# Patient Record
Sex: Female | Born: 1990 | Race: Black or African American | Hispanic: No | Marital: Single | Smoking: Current every day smoker
Health system: Southern US, Community
[De-identification: ages and names within clinical notes are randomized; demographics above are authoritative.]

## PROBLEM LIST (undated history)

## (undated) ENCOUNTER — Inpatient Hospital Stay (HOSPITAL_COMMUNITY): Payer: Self-pay

## (undated) DIAGNOSIS — IMO0002 Reserved for concepts with insufficient information to code with codable children: Secondary | ICD-10-CM

## (undated) DIAGNOSIS — A5901 Trichomonal vulvovaginitis: Secondary | ICD-10-CM

## (undated) DIAGNOSIS — O163 Unspecified maternal hypertension, third trimester: Secondary | ICD-10-CM

## (undated) DIAGNOSIS — A749 Chlamydial infection, unspecified: Secondary | ICD-10-CM

## (undated) DIAGNOSIS — A599 Trichomoniasis, unspecified: Secondary | ICD-10-CM

## (undated) DIAGNOSIS — J3501 Chronic tonsillitis: Secondary | ICD-10-CM

## (undated) HISTORY — DX: Unspecified maternal hypertension, third trimester: O16.3

## (undated) HISTORY — DX: Trichomoniasis, unspecified: A59.9

---
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
## 2013-02-16 ENCOUNTER — Emergency Department (INDEPENDENT_AMBULATORY_CARE_PROVIDER_SITE_OTHER)
Admission: EM | Admit: 2013-02-16 | Discharge: 2013-02-16 | Disposition: A | Discharge status: Home / Self Care | Payer: 59 | Source: Home / Self Care | Attending: Emergency Medicine | Admitting: Emergency Medicine

## 2013-02-16 ENCOUNTER — Encounter (HOSPITAL_COMMUNITY): Payer: Self-pay | Admitting: *Deleted

## 2013-02-16 DIAGNOSIS — K5289 Other specified noninfective gastroenteritis and colitis: Secondary | ICD-10-CM

## 2013-02-16 DIAGNOSIS — K529 Noninfective gastroenteritis and colitis, unspecified: Secondary | ICD-10-CM

## 2013-02-16 LAB — URINALYSIS, ROUTINE W REFLEX MICROSCOPIC
Nitrite: POSITIVE — AB
Specific Gravity, Urine: 1.023 (ref 1.005–1.030)
Urobilinogen, UA: 1 mg/dL (ref 0.0–1.0)

## 2013-02-16 LAB — CBC WITH DIFFERENTIAL/PLATELET
Basophils Absolute: 0 10*3/uL (ref 0.0–0.1)
Basophils Relative: 0 % (ref 0–1)
Eosinophils Absolute: 0.4 10*3/uL (ref 0.0–0.7)
Hemoglobin: 12.5 g/dL (ref 12.0–15.0)
MCH: 30.6 pg (ref 26.0–34.0)
MCHC: 35.5 g/dL (ref 30.0–36.0)
Monocytes Absolute: 1.2 10*3/uL — ABNORMAL HIGH (ref 0.1–1.0)
Monocytes Relative: 12 % (ref 3–12)
Neutrophils Relative %: 50 % (ref 43–77)
RDW: 12.7 % (ref 11.5–15.5)

## 2013-02-16 LAB — POCT I-STAT, CHEM 8
HCT: 39 % (ref 36.0–46.0)
Hemoglobin: 13.3 g/dL (ref 12.0–15.0)
Sodium: 137 mEq/L (ref 135–145)
TCO2: 25 mmol/L (ref 0–100)

## 2013-02-16 LAB — URINE MICROSCOPIC-ADD ON

## 2013-02-16 LAB — POCT PREGNANCY, URINE: Preg Test, Ur: NEGATIVE

## 2013-02-16 MED ORDER — SODIUM CHLORIDE 0.9 % IV SOLN
INTRAVENOUS | Status: DC
  Administered 2013-02-16: 15:00:00 via INTRAVENOUS

## 2013-02-16 MED ORDER — ONDANSETRON 8 MG PO TBDP: 8.0000 mg | ORAL_TABLET | Freq: Three times a day (TID) | ORAL | Status: DC | PRN

## 2013-02-16 MED ORDER — CIPROFLOXACIN HCL 500 MG PO TABS: 500.0000 mg | ORAL_TABLET | Freq: Two times a day (BID) | ORAL | Status: DC

## 2013-02-16 MED ORDER — METRONIDAZOLE 500 MG PO TABS: 500.0000 mg | ORAL_TABLET | Freq: Two times a day (BID) | ORAL | Status: DC

## 2013-02-16 NOTE — ED Provider Notes (Addendum)
Chief Complaint:   Chief Complaint  Patient presents with  . Nausea    History of Present Illness:   Lorraine Rogers is a 22 year old female who has had a six-day history of headache, dizziness, postural presyncope, chills, and felt feverish she had diarrhea with 6-8 loose stools per day without blood or mucus. She felt nauseated but did not vomit. She had pain in her lower back but denies any abdominal pain. She's had no urinary or GYN complaints. She has not been around anyone with similar symptoms. She had dinner at [facility] the night before the symptoms came on. No other suspicious ingestions. No foreign travel or exotic animal exposure.  Review of Systems:  Other than noted above, the patient denies any of the following symptoms: Systemic:  No fevers, chills, sweats, weight loss or gain, fatigue, or tiredness. ENT:  No nasal congestion, rhinorrhea, or sore throat. Lungs:  No cough, wheezing, or shortness of breath. Cardiac:  No chest pain, syncope, or presyncope. GI:  No abdominal pain, nausea, vomiting, anorexia, diarrhea, constipation, blood in stool or vomitus. GU:  No dysuria, frequency, or urgency.  PMFSH:  Past medical history, family history, social history, meds, and allergies were reviewed.  She is allergic to penicillin sulfa. Her only medication is birth control pills. She is breast-feeding.  Physical Exam:   Vital signs:  BP 94/49  Pulse 92  Temp(Src) 98.7 F (37.1 C) (Oral)  Resp 20  SpO2 100%  LMP 02/16/2013 Filed Vitals:   02/16/13 1355 Supine  02/16/13 1356 Sitting  02/16/13 1357 Standing   BP: 117/70 111/70 94/49  Pulse: 78 83 92  Temp: 98.7 F (37.1 C)    TempSrc: Oral    Resp: 20    SpO2: 100%     General:  Alert and oriented.  In no distress.  Skin warm and dry.  Good skin turgor, brisk capillary refill. ENT:  No scleral icterus, moist mucous membranes, no oral lesions, pharynx clear. Lungs:  Breath sounds clear and equal bilaterally.  No wheezes, rales,  or rhonchi. Heart:  Rhythm regular, without extrasystoles.  No gallops or murmers. Abdomen:  Soft, flat, nondistended. There was no tenderness to palpation, no guarding or rebound. No organomegaly or mass. Bowel sounds were hyperactive. Skin: Clear, warm, and dry.  Good turgor.  Brisk capillary refill.  Labs:   Results for orders placed during the hospital encounter of 02/16/13  CBC WITH DIFFERENTIAL      Result Value Range   WBC 9.6  4.0 - 10.5 K/uL   RBC 4.08  3.87 - 5.11 MIL/uL   Hemoglobin 12.5  12.0 - 15.0 g/dL   HCT 16.1 (*) 09.6 - 04.5 %   MCV 86.3  78.0 - 100.0 fL   MCH 30.6  26.0 - 34.0 pg   MCHC 35.5  30.0 - 36.0 g/dL   RDW 40.9  81.1 - 91.4 %   Platelets 269  150 - 400 K/uL   Neutrophils Relative % 50  43 - 77 %   Neutro Abs 4.8  1.7 - 7.7 K/uL   Lymphocytes Relative 34  12 - 46 %   Lymphs Abs 3.3  0.7 - 4.0 K/uL   Monocytes Relative 12  3 - 12 %   Monocytes Absolute 1.2 (*) 0.1 - 1.0 K/uL   Eosinophils Relative 4  0 - 5 %   Eosinophils Absolute 0.4  0.0 - 0.7 K/uL   Basophils Relative 0  0 - 1 %   Basophils  Absolute 0.0  0.0 - 0.1 K/uL  URINALYSIS, ROUTINE W REFLEX MICROSCOPIC      Result Value Range   Color, Urine RED (*) YELLOW   APPearance TURBID (*) CLEAR   Specific Gravity, Urine 1.023  1.005 - 1.030   pH 5.5  5.0 - 8.0   Glucose, UA NEGATIVE  NEGATIVE mg/dL   Hgb urine dipstick LARGE (*) NEGATIVE   Bilirubin Urine MODERATE (*) NEGATIVE   Ketones, ur 15 (*) NEGATIVE mg/dL   Protein, ur 454 (*) NEGATIVE mg/dL   Urobilinogen, UA 1.0  0.0 - 1.0 mg/dL   Nitrite POSITIVE (*) NEGATIVE   Leukocytes, UA MODERATE (*) NEGATIVE  URINE MICROSCOPIC-ADD ON      Result Value Range   Squamous Epithelial / LPF FEW (*) RARE   WBC, UA 21-50  <3 WBC/hpf   RBC / HPF TOO NUMEROUS TO COUNT  <3 RBC/hpf   Bacteria, UA MANY (*) RARE   Urine-Other TRICHOMONAS PRESENT    POCT PREGNANCY, URINE      Result Value Range   Preg Test, Ur NEGATIVE  NEGATIVE  POCT I-STAT, CHEM 8       Result Value Range   Sodium 137  135 - 145 mEq/L   Potassium 4.1  3.5 - 5.1 mEq/L   Chloride 102  96 - 112 mEq/L   BUN 8  6 - 23 mg/dL   Creatinine, Ser 0.98  0.50 - 1.10 mg/dL   Glucose, Bld 92  70 - 99 mg/dL   Calcium, Ion 1.19 (*) 1.12 - 1.23 mmol/L   TCO2 25  0 - 100 mmol/L   Hemoglobin 13.3  12.0 - 15.0 g/dL   HCT 14.7  82.9 - 56.2 %      Course in Urgent Care Center:   She was given 1 L of normal saline intravenously. Thereafter she felt a lot better.  Assessment:  The encounter diagnosis was Gastroenteritis.  May also have a urinary tract infection. Urine culture is pending. Also she has Trichomonas in her urine and will need to treat this as well. Will obtain a stool for culture and Clostridium difficile PCR. We'll go ahead and treat with Cipro since this is been going on for almost a week. Return if no better in 3-4 days. The patient was reminded not to breast-feed while taking the antibiotics.  Plan:   1.  The following meds were prescribed:   Discharge Medication List as of 02/16/2013  5:24 PM    START taking these medications   Details  ciprofloxacin (CIPRO) 500 MG tablet Take 1 tablet (500 mg total) by mouth every 12 (twelve) hours., Starting 02/16/2013, Until Discontinued, Normal    ondansetron (ZOFRAN ODT) 8 MG disintegrating tablet Take 1 tablet (8 mg total) by mouth every 8 (eight) hours as needed for nausea., Starting 02/16/2013, Until Discontinued, Normal       A prescription for Flagyl 500 mg twice a day for one week was also called into her pharmacy.  2.  The patient was instructed in symptomatic care and handouts were given. 3.  The patient was told to return if becoming worse in any way, if no better in 2 or 3 days, and given some red flag symptoms such as fever, worsening pain, or persistent vomiting that would indicate earlier return. 4.  The patient was told to take only sips of clear liquids for the next 24 hours and then advance to a b.r.a.t. Diet. 5.   Follow up here if  necessary.      Reuben Likes, MD 02/16/13 1939  Addendum: The patient's stool showed abundant yeast but no salmonella, shigella, Campylobacter, or Escherichia coli. Does not require any further treatment.  Reuben Likes, MD 03/06/13 2125

## 2013-02-16 NOTE — ED Notes (Signed)
Patient complains of nausea x 5 days; with cough, fever and chills. Denies diarrhea, vomiting. States pain in abdomen right lateral side.

## 2013-02-18 LAB — URINE CULTURE

## 2013-02-18 NOTE — ED Notes (Addendum)
Urine culture: >100,000 colonies E. Coli. Pt. adequately treated with Cipro. Vassie Moselle 02/18/2013 [facility] mailed a stool culture report: Abundant yeast, No salmonella, Shigella, Campylobacter, Yersinia, or E. Coli isolated. Reduced Flora present.  Lab shown to Dr. Artis Flock.  He said no further action and show it to Dr. Lorenz Coaster. I do not see the result on pt.'s chart. 03/04/2013

## 2014-01-14 ENCOUNTER — Encounter (HOSPITAL_BASED_OUTPATIENT_CLINIC_OR_DEPARTMENT_OTHER): Payer: Self-pay | Admitting: *Deleted

## 2014-01-19 ENCOUNTER — Encounter (HOSPITAL_BASED_OUTPATIENT_CLINIC_OR_DEPARTMENT_OTHER)
Admission: RE | Disposition: A | Discharge status: Home / Self Care | Payer: Self-pay | Source: Ambulatory Visit | Attending: Otolaryngology

## 2014-01-19 ENCOUNTER — Ambulatory Visit (HOSPITAL_BASED_OUTPATIENT_CLINIC_OR_DEPARTMENT_OTHER)
Admission: RE | Admit: 2014-01-19 | Discharge: 2014-01-19 | Disposition: A | Discharge status: Home / Self Care | Payer: 59 | Source: Ambulatory Visit | Attending: Otolaryngology | Admitting: Otolaryngology

## 2014-01-19 ENCOUNTER — Ambulatory Visit (HOSPITAL_BASED_OUTPATIENT_CLINIC_OR_DEPARTMENT_OTHER): Payer: 59 | Admitting: Anesthesiology

## 2014-01-19 ENCOUNTER — Encounter (HOSPITAL_BASED_OUTPATIENT_CLINIC_OR_DEPARTMENT_OTHER): Payer: 59 | Admitting: Anesthesiology

## 2014-01-19 ENCOUNTER — Encounter (HOSPITAL_BASED_OUTPATIENT_CLINIC_OR_DEPARTMENT_OTHER): Payer: Self-pay | Admitting: *Deleted

## 2014-01-19 DIAGNOSIS — J3501 Chronic tonsillitis: Secondary | ICD-10-CM

## 2014-01-19 DIAGNOSIS — F1010 Alcohol abuse, uncomplicated: Secondary | ICD-10-CM | POA: Insufficient documentation

## 2014-01-19 DIAGNOSIS — Z79899 Other long term (current) drug therapy: Secondary | ICD-10-CM | POA: Insufficient documentation

## 2014-01-19 DIAGNOSIS — Z880 Allergy status to penicillin: Secondary | ICD-10-CM | POA: Insufficient documentation

## 2014-01-19 HISTORY — DX: Chronic tonsillitis: J35.01

## 2014-01-19 HISTORY — PX: TONSILLECTOMY: SHX5217

## 2014-01-19 LAB — POCT HEMOGLOBIN-HEMACUE: Hemoglobin: 12.4 g/dL (ref 12.0–15.0)

## 2014-01-19 SURGERY — TONSILLECTOMY
Anesthesia: General | Site: Throat

## 2014-01-19 MED ORDER — ONDANSETRON HCL 4 MG/2ML IJ SOLN
INTRAMUSCULAR | Status: DC | PRN
  Administered 2014-01-19: 4 mg via INTRAVENOUS

## 2014-01-19 MED ORDER — SUCCINYLCHOLINE CHLORIDE 20 MG/ML IJ SOLN
INTRAMUSCULAR | Status: DC | PRN
  Administered 2014-01-19: 100 mg via INTRAVENOUS

## 2014-01-19 MED ORDER — PROMETHAZINE HCL 12.5 MG RE SUPP: 12.5000 mg | Freq: Four times a day (QID) | RECTAL | Status: DC | PRN

## 2014-01-19 MED ORDER — PROPOFOL 10 MG/ML IV BOLUS
INTRAVENOUS | Status: DC | PRN
  Administered 2014-01-19: 250 mg via INTRAVENOUS

## 2014-01-19 MED ORDER — MORPHINE SULFATE 2 MG/ML IJ SOLN: 2.0000 mg | INTRAMUSCULAR | Status: DC | PRN

## 2014-01-19 MED ORDER — LIDOCAINE HCL (CARDIAC) 10 MG/ML IV SOLN
INTRAVENOUS | Status: DC | PRN
  Administered 2014-01-19: 100 mg via INTRAVENOUS

## 2014-01-19 MED ORDER — HYDROMORPHONE HCL PF 1 MG/ML IJ SOLN
0.2500 mg | INTRAMUSCULAR | Status: DC | PRN
  Administered 2014-01-19 (×2): 0.5 mg via INTRAVENOUS

## 2014-01-19 MED ORDER — MIDAZOLAM HCL 2 MG/2ML IJ SOLN: 1.0000 mg | INTRAMUSCULAR | Status: DC | PRN

## 2014-01-19 MED ORDER — PROMETHAZINE HCL 12.5 MG PO TABS: 12.5000 mg | ORAL_TABLET | Freq: Four times a day (QID) | ORAL | Status: DC | PRN

## 2014-01-19 MED ORDER — BACITRACIN ZINC 500 UNIT/GM EX OINT
TOPICAL_OINTMENT | CUTANEOUS | Status: AC
  Filled 2014-01-19: qty 0.9

## 2014-01-19 MED ORDER — HYDROMORPHONE HCL PF 1 MG/ML IJ SOLN
INTRAMUSCULAR | Status: AC
  Filled 2014-01-19: qty 1

## 2014-01-19 MED ORDER — OXYCODONE HCL 5 MG PO TABS: 5.0000 mg | ORAL_TABLET | Freq: Once | ORAL | Status: DC | PRN

## 2014-01-19 MED ORDER — KCL IN DEXTROSE-NACL 20-5-0.45 MEQ/L-%-% IV SOLN
INTRAVENOUS | Status: DC
  Administered 2014-01-19: 12:00:00 via INTRAVENOUS
  Filled 2014-01-19: qty 1000

## 2014-01-19 MED ORDER — LACTATED RINGERS IV SOLN
INTRAVENOUS | Status: DC
  Administered 2014-01-19 (×2): via INTRAVENOUS

## 2014-01-19 MED ORDER — HYDROCODONE-ACETAMINOPHEN 7.5-325 MG/15ML PO SOLN
15.0000 mL | ORAL | Status: DC | PRN
  Administered 2014-01-19: 15 mL via ORAL
  Filled 2014-01-19: qty 15

## 2014-01-19 MED ORDER — DEXAMETHASONE SODIUM PHOSPHATE 4 MG/ML IJ SOLN
INTRAMUSCULAR | Status: DC | PRN
  Administered 2014-01-19: 10 mg via INTRAVENOUS

## 2014-01-19 MED ORDER — BACITRACIN 500 UNIT/GM EX OINT
TOPICAL_OINTMENT | CUTANEOUS | Status: DC | PRN
Start: 2014-01-19 — End: 2014-01-19
  Administered 2014-01-19: 1 via TOPICAL

## 2014-01-19 MED ORDER — OXYMETAZOLINE HCL 0.05 % NA SOLN
NASAL | Status: AC
  Filled 2014-01-19: qty 15

## 2014-01-19 MED ORDER — METOCLOPRAMIDE HCL 5 MG/ML IJ SOLN: 10.0000 mg | Freq: Once | INTRAMUSCULAR | Status: DC | PRN

## 2014-01-19 MED ORDER — FENTANYL CITRATE 0.05 MG/ML IJ SOLN: 50.0000 ug | INTRAMUSCULAR | Status: DC | PRN

## 2014-01-19 MED ORDER — OXYCODONE HCL 5 MG/5ML PO SOLN: 5.0000 mg | Freq: Once | ORAL | Status: DC | PRN

## 2014-01-19 MED ORDER — HYDROCODONE-ACETAMINOPHEN 7.5-325 MG/15ML PO SOLN: 15.0000 mL | Freq: Four times a day (QID) | ORAL | Status: AC | PRN

## 2014-01-19 MED ORDER — METOCLOPRAMIDE HCL 5 MG/ML IJ SOLN
INTRAMUSCULAR | Status: DC | PRN
  Administered 2014-01-19: 10 mg via INTRAVENOUS

## 2014-01-19 MED ORDER — FENTANYL CITRATE 0.05 MG/ML IJ SOLN
INTRAMUSCULAR | Status: DC | PRN
  Administered 2014-01-19 (×2): 50 ug via INTRAVENOUS
  Administered 2014-01-19: 100 ug via INTRAVENOUS

## 2014-01-19 MED ORDER — MIDAZOLAM HCL 2 MG/2ML IJ SOLN
INTRAMUSCULAR | Status: AC
  Filled 2014-01-19: qty 2

## 2014-01-19 MED ORDER — FENTANYL CITRATE 0.05 MG/ML IJ SOLN
INTRAMUSCULAR | Status: AC
  Filled 2014-01-19: qty 6

## 2014-01-19 MED ORDER — MIDAZOLAM HCL 5 MG/5ML IJ SOLN
INTRAMUSCULAR | Status: DC | PRN
  Administered 2014-01-19: 2 mg via INTRAVENOUS

## 2014-01-19 SURGICAL SUPPLY — 29 items
CANISTER SUCT 1200ML W/VALVE (MISCELLANEOUS) ×3 IMPLANT
CATH ROBINSON RED A/P 10FR (CATHETERS) IMPLANT
CATH ROBINSON RED A/P 14FR (CATHETERS) IMPLANT
CLEANER CAUTERY TIP 5X5 PAD (MISCELLANEOUS) IMPLANT
COAGULATOR SUCT 6 FR SWTCH (ELECTROSURGICAL) ×1
COAGULATOR SUCT SWTCH 10FR 6 (ELECTROSURGICAL) ×2 IMPLANT
COVER MAYO STAND STRL (DRAPES) ×3 IMPLANT
ELECT COATED BLADE 2.86 ST (ELECTRODE) ×3 IMPLANT
ELECT REM PT RETURN 9FT ADLT (ELECTROSURGICAL) ×3
ELECT REM PT RETURN 9FT PED (ELECTROSURGICAL)
ELECTRODE REM PT RETRN 9FT PED (ELECTROSURGICAL) IMPLANT
ELECTRODE REM PT RTRN 9FT ADLT (ELECTROSURGICAL) IMPLANT
GLOVE BIO SURGEON STRL SZ7.5 (GLOVE) ×3 IMPLANT
GLOVE ECLIPSE 6.5 STRL STRAW (GLOVE) ×2 IMPLANT
GOWN STRL REUS W/ TWL LRG LVL3 (GOWN DISPOSABLE) ×2 IMPLANT
GOWN STRL REUS W/TWL LRG LVL3 (GOWN DISPOSABLE) ×6
NS IRRIG 1000ML POUR BTL (IV SOLUTION) ×3 IMPLANT
PAD CLEANER CAUTERY TIP 5X5 (MISCELLANEOUS)
PENCIL FOOT CONTROL (ELECTRODE) ×3 IMPLANT
SHEET MEDIUM DRAPE 40X70 STRL (DRAPES) ×3 IMPLANT
SOLUTION BUTLER CLEAR DIP (MISCELLANEOUS) ×3 IMPLANT
SPONGE GAUZE 4X4 12PLY STER LF (GAUZE/BANDAGES/DRESSINGS) ×3 IMPLANT
SPONGE TONSIL 1 RF SGL (DISPOSABLE) IMPLANT
SPONGE TONSIL 1.25 RF SGL STRG (GAUZE/BANDAGES/DRESSINGS) ×2 IMPLANT
SYR BULB 3OZ (MISCELLANEOUS) ×3 IMPLANT
TOWEL OR 17X24 6PK STRL BLUE (TOWEL DISPOSABLE) ×3 IMPLANT
TUBE CONNECTING 20'X1/4 (TUBING) ×1
TUBE CONNECTING 20X1/4 (TUBING) ×2 IMPLANT
TUBE SALEM SUMP 16 FR W/ARV (TUBING) ×3 IMPLANT

## 2014-01-19 NOTE — Anesthesia Postprocedure Evaluation (Signed)
Anesthesia Post Note  Patient: Lorraine Rogers  Procedure(s) Performed: Procedure(s) (LRB): TONSILLECTOMY (N/A)  Anesthesia type: General  Patient location: PACU  Post pain: Pain level controlled  Post assessment: Patient's Cardiovascular Status Stable  Last Vitals:  Filed Vitals:   01/19/14 1159  BP: 120/73  Pulse: 58  Temp: 36.7 C  Resp: 16    Post vital signs: Reviewed and stable  Level of consciousness: alert  Complications: No apparent anesthesia complications

## 2014-01-19 NOTE — Anesthesia Preprocedure Evaluation (Signed)
Anesthesia Evaluation  Patient identified by MRN, date of birth, ID band Patient awake    Reviewed: Allergy & Precautions, H&P , NPO status , Patient's Chart, lab work & pertinent test results, reviewed documented beta blocker date and time   Airway Mallampati: II TM Distance: >3 FB Neck ROM: full    Dental   Pulmonary neg pulmonary ROS,  breath sounds clear to auscultation        Cardiovascular negative cardio ROS  Rhythm:regular     Neuro/Psych negative neurological ROS  negative psych ROS   GI/Hepatic negative GI ROS, Neg liver ROS,   Endo/Other  negative endocrine ROS  Renal/GU negative Renal ROS  negative genitourinary   Musculoskeletal   Abdominal   Peds  Hematology negative hematology ROS (+)   Anesthesia Other Findings See surgeon's H&P   Reproductive/Obstetrics negative OB ROS                           Anesthesia Physical Anesthesia Plan  ASA: III  Anesthesia Plan: General   Post-op Pain Management:    Induction: Intravenous  Airway Management Planned: Oral ETT  Additional Equipment:   Intra-op Plan:   Post-operative Plan: Extubation in OR  Informed Consent: I have reviewed the patients History and Physical, chart, labs and discussed the procedure including the risks, benefits and alternatives for the proposed anesthesia with the patient or authorized representative who has indicated his/her understanding and acceptance.   Dental Advisory Given  Plan Discussed with: CRNA and Surgeon  Anesthesia Plan Comments:         Anesthesia Quick Evaluation

## 2014-01-19 NOTE — Discharge Instructions (Signed)
Call your surgeon if you experience:  °1.  Fever over 101.0. °2.  Inability to urinate. °3.  Nausea and/or vomiting. °4.  Extreme swelling or bruising at the surgical site. °5.  Continued bleeding from the incision. °6.  Increased pain, redness or drainage from the incision. °7.  Problems related to your pain medication. °8. Any change in color, movement and/or sensation °9. Any problems and/or concerns ° ° °Post Anesthesia Home Care Instructions °Activity: °Get plenty of rest for the remainder of the day. A responsible adult should stay with you for 24 hours following the procedure.  °For the next 24 hours, DO NOT: °-Drive a car °-Operate machinery °-Drink alcoholic beverages °-Take any medication unless instructed by your physician °-Make any legal decisions or sign important papers. ° °Meals: °Start with liquid foods such as gelatin or soup. Progress to regular foods as tolerated. Avoid greasy, spicy, heavy foods. If nausea and/or vomiting occur, drink only clear liquids until the nausea and/or vomiting subsides. Call your physician if vomiting continues. ° °Special Instructions/Symptoms: °Your throat may feel dry or sore from the anesthesia or the breathing tube placed in your throat during surgery. If this causes discomfort, gargle with warm salt water. The discomfort should disappear within 24 hours. ° °

## 2014-01-19 NOTE — H&P (Signed)
Lorraine Rogers is an 23 y.o. female.   Chief Complaint: Chronic tonsillitis HPI: 23 year old female with a couple of recent episodes of severe tonsillitis presents for tonsillectomy.  Past Medical History  Diagnosis Date  . Tonsillitis, chronic   . No pertinent past medical history     Past Surgical History  Procedure Laterality Date  . No past surgeries      Family History  Problem Relation Age of Onset  . Diabetes Mother    Social History:  reports that she has never smoked. She does not have any smokeless tobacco history on file. She reports that she drinks alcohol. She reports that she does not use illicit drugs.  Allergies:  Allergies  Allergen Reactions  . Penicillins Hives and Rash  . Sulfa Antibiotics Hives and Rash    Medications Prior to Admission  Medication Sig Dispense Refill  . PRESCRIPTION MEDICATION Take 1 tablet by mouth daily. Birth Control Pill        Results for orders placed during the hospital encounter of 01/19/14 (from the past 48 hour(s))  POCT HEMOGLOBIN-HEMACUE     Status: None   Collection Time    01/19/14  9:26 AM      Result Value Ref Range   Hemoglobin 12.4  12.0 - 15.0 g/dL   No results found.  Review of Systems  All other systems reviewed and are negative.   Blood pressure 126/75, pulse 59, temperature 98.4 F (36.9 C), temperature source Oral, resp. rate 20, height 5\' 6"  (1.676 m), weight 117.935 kg (260 lb), last menstrual period 11/14/2013, SpO2 99.00%. Physical Exam  Constitutional: She is oriented to person, place, and time. She appears well-developed and well-nourished. No distress.  HENT:  Head: Normocephalic and atraumatic.  Right Ear: External ear normal.  Left Ear: External ear normal.  Nose: Nose normal.  Mouth/Throat: Oropharynx is clear and moist.  Eyes: Conjunctivae and EOM are normal. Pupils are equal, round, and reactive to light.  Neck: Normal range of motion. Neck supple.  Cardiovascular: Normal rate.    Respiratory: Effort normal.  Musculoskeletal: Normal range of motion.  Neurological: She is alert and oriented to person, place, and time. No cranial nerve deficit.  Skin: Skin is warm and dry.  Psychiatric: She has a normal mood and affect. Her behavior is normal. Judgment and thought content normal.     Assessment/Plan Chronic tonsillitis To OR for tonsillectomy.  Kenon Delashmit 01/19/2014, 9:56 AM

## 2014-01-19 NOTE — Brief Op Note (Signed)
01/19/2014  10:33 AM  PATIENT:  Lorraine Rogers  23 y.o. female  PRE-OPERATIVE DIAGNOSIS:  chronic tonsillitis and hypertrophy  POST-OPERATIVE DIAGNOSIS:  chronic tonsillitis and hypertrophy  PROCEDURE:  Procedure(s): TONSILLECTOMY (N/A)  SURGEON:  Surgeon(s) and Role:    * Christia Readingwight Aaronmichael Brumbaugh, MD - Primary  PHYSICIAN ASSISTANT:   ASSISTANTS: none   ANESTHESIA:   general  EBL:     BLOOD ADMINISTERED:none  DRAINS: none   LOCAL MEDICATIONS USED:  NONE  SPECIMEN:  Source of Specimen:  Right and left tonsils  DISPOSITION OF SPECIMEN:  PATHOLOGY  COUNTS:  YES  TOURNIQUET:  * No tourniquets in log *  DICTATION: .Other Dictation: Dictation Number [id number]  PLAN OF CARE: Admit for overnight observation  PATIENT DISPOSITION:  PACU - hemodynamically stable.   Delay start of Pharmacological VTE agent (>24hrs) due to surgical blood loss or risk of bleeding: yes

## 2014-01-19 NOTE — Transfer of Care (Signed)
Immediate Anesthesia Transfer of Care Note  Patient: Lorraine Rogers  Procedure(s) Performed: Procedure(s): TONSILLECTOMY (N/A)  Patient Location: PACU  Anesthesia Type:General  Level of Consciousness: awake, sedated and patient cooperative  Airway & Oxygen Therapy: Patient Spontanous Breathing and aerosol face mask  Post-op Assessment: Report given to PACU RN and Post -op Vital signs reviewed and stable  Post vital signs: Reviewed and stable  Complications: No apparent anesthesia complications

## 2014-01-19 NOTE — Anesthesia Procedure Notes (Signed)
Procedure Name: Intubation Date/Time: 01/19/2014 10:09 AM Performed by: Gar GibbonKEETON, Leshae Mcclay S Pre-anesthesia Checklist: Patient identified, Emergency Drugs available, Suction available and Patient being monitored Patient Re-evaluated:Patient Re-evaluated prior to inductionOxygen Delivery Method: Circle System Utilized Preoxygenation: Pre-oxygenation with 100% oxygen Intubation Type: IV induction Ventilation: Mask ventilation without difficulty Laryngoscope Size: Mac and 3 Grade View: Grade II Tube type: Oral Tube size: 7.0 mm Number of attempts: 1 Airway Equipment and Method: stylet and oral airway Placement Confirmation: ETT inserted through vocal cords under direct vision,  positive ETCO2 and breath sounds checked- equal and bilateral Secured at: 20 cm Tube secured with: Tape Dental Injury: Teeth and Oropharynx as per pre-operative assessment

## 2014-01-20 ENCOUNTER — Encounter (HOSPITAL_BASED_OUTPATIENT_CLINIC_OR_DEPARTMENT_OTHER): Payer: Self-pay | Admitting: Otolaryngology

## 2014-01-20 NOTE — Op Note (Signed)
Lorraine Rogers:  Lorraine Rogers, Lorraine Rogers                ACCOUNT NO.:  [id number]  MEDICAL RECORD NO.:  [id number]  LOCATION:                                 FACILITY:  PHYSICIAN:  Antony Contraswight D Alvina Strother, MD     DATE OF BIRTH:  04/17/1991  DATE OF PROCEDURE:  01/19/2014 DATE OF DISCHARGE:  01/19/2014                              OPERATIVE REPORT   PREOPERATIVE DIAGNOSIS:  Chronic tonsillitis and tonsillar hypertrophy.  POSTOPERATIVE DIAGNOSES:  Chronic tonsillitis and tonsillar hypertrophy.  PROCEDURE:  Tonsillectomy.  SURGEON:  Antony Contraswight D Draycen Leichter, MD  ANESTHESIA:  General endotracheal anesthesia.  COMPLICATIONS:  None.  INDICATION:  The patient is a 23 year old female, who has had a couple of recent severe tonsil infections that have required antibiotic and steroid therapy.  In addition, her tonsils were found to be enlarged. She presents to the operating room for surgical management.  FINDINGS:  Tonsils are 3+.  DESCRIPTION OF PROCEDURE:  The patient was identified in the holding room.  Informed consent having been obtained including discussion of risks, benefits, alternatives.  The patient was brought to the operative suite, and put on the operating table in supine position.  Anesthesia was induced.  The patient was intubated by anesthesia team without difficulty.  The patient was given intravenous steroids during the case. The eyes were taped closed and bed was turned 90 degrees from anesthesia.  Head wrap was placed around the patient's head and a Crowe- Davis retractor was inserted in the mouth and opened through the oropharynx.  This placed in suspension on Mayo stand using a rack.  The right tonsil was grasped with a curved Allis and retracted medially while a curvilinear incision was made along the anterior tonsillar pillar using Bovie electrocautery on a setting of 20.  Dissection continued subcapsular plane until the tonsil was removed.  The same was then done on the left side.  Tonsils were  sent separately for pathology. Bleeding was controlled on both sides using suction cautery.  There was a bit of blood loss in this process as there were sizable bleeders on both sides.  After bleeding was controlled, the nose and throat were copiously irrigated with saline and a flexible catheter was passed down the esophagus to suck out the stomach and esophagus.  Retractor was taken out of suspension and removed from the patient's mouth.  She was turned back to anesthesia for wake-up and was extubated, moved to room in stable condition.     Antony Contraswight D Latrena Benegas, MD     DDB/MEDQ  D:  01/19/2014  T:  01/20/2014  Job:  [id number]

## 2014-02-05 ENCOUNTER — Other Ambulatory Visit: Payer: Self-pay | Admitting: Family

## 2014-02-05 DIAGNOSIS — R52 Pain, unspecified: Secondary | ICD-10-CM

## 2014-02-05 DIAGNOSIS — T148XXA Other injury of unspecified body region, initial encounter: Secondary | ICD-10-CM

## 2014-02-06 ENCOUNTER — Ambulatory Visit
Admission: RE | Admit: 2014-02-06 | Discharge: 2014-02-06 | Disposition: A | Discharge status: Home / Self Care | Payer: 59 | Source: Ambulatory Visit | Attending: Family | Admitting: Family

## 2014-02-06 DIAGNOSIS — T148XXA Other injury of unspecified body region, initial encounter: Secondary | ICD-10-CM

## 2014-02-06 DIAGNOSIS — R52 Pain, unspecified: Secondary | ICD-10-CM

## 2014-05-04 ENCOUNTER — Encounter (HOSPITAL_BASED_OUTPATIENT_CLINIC_OR_DEPARTMENT_OTHER): Payer: Self-pay | Admitting: Otolaryngology

## 2014-11-20 ENCOUNTER — Emergency Department (HOSPITAL_COMMUNITY)
Admission: EM | Admit: 2014-11-20 | Discharge: 2014-11-20 | Disposition: A | Discharge status: Home / Self Care | Payer: 59 | Attending: Emergency Medicine | Admitting: Emergency Medicine

## 2014-11-20 ENCOUNTER — Encounter (HOSPITAL_COMMUNITY): Payer: Self-pay | Admitting: Emergency Medicine

## 2014-11-20 ENCOUNTER — Emergency Department (HOSPITAL_COMMUNITY): Payer: 59

## 2014-11-20 DIAGNOSIS — Y9289 Other specified places as the place of occurrence of the external cause: Secondary | ICD-10-CM | POA: Insufficient documentation

## 2014-11-20 DIAGNOSIS — Z720 Tobacco use: Secondary | ICD-10-CM | POA: Insufficient documentation

## 2014-11-20 DIAGNOSIS — Y9389 Activity, other specified: Secondary | ICD-10-CM | POA: Insufficient documentation

## 2014-11-20 DIAGNOSIS — Z8709 Personal history of other diseases of the respiratory system: Secondary | ICD-10-CM | POA: Insufficient documentation

## 2014-11-20 DIAGNOSIS — Y998 Other external cause status: Secondary | ICD-10-CM | POA: Insufficient documentation

## 2014-11-20 DIAGNOSIS — Z880 Allergy status to penicillin: Secondary | ICD-10-CM | POA: Diagnosis not present

## 2014-11-20 DIAGNOSIS — Z793 Long term (current) use of hormonal contraceptives: Secondary | ICD-10-CM | POA: Diagnosis not present

## 2014-11-20 DIAGNOSIS — S6991XA Unspecified injury of right wrist, hand and finger(s), initial encounter: Secondary | ICD-10-CM | POA: Diagnosis present

## 2014-11-20 DIAGNOSIS — S60221A Contusion of right hand, initial encounter: Secondary | ICD-10-CM | POA: Diagnosis not present

## 2014-11-20 NOTE — Discharge Instructions (Signed)
Contusion °A contusion is a deep bruise. Contusions are the result of an injury that caused bleeding under the skin. The contusion may turn blue, purple, or yellow. Minor injuries will give you a painless contusion, but more severe contusions may stay painful and swollen for a few weeks.  °CAUSES  °A contusion is usually caused by a blow, trauma, or direct force to an area of the body. °SYMPTOMS  °· Swelling and redness of the injured area. °· Bruising of the injured area. °· Tenderness and soreness of the injured area. °· Pain. °DIAGNOSIS  °The diagnosis can be made by taking a history and physical exam. An X-ray, CT scan, or MRI may be needed to determine if there were any associated injuries, such as fractures. °TREATMENT  °Specific treatment will depend on what area of the body was injured. In general, the best treatment for a contusion is resting, icing, elevating, and applying cold compresses to the injured area. Over-the-counter medicines may also be recommended for pain control. Ask your caregiver what the best treatment is for your contusion. °HOME CARE INSTRUCTIONS  °· Put ice on the injured area. °¨ Put ice in a plastic bag. °¨ Place a towel between your skin and the bag. °¨ Leave the ice on for 15-20 minutes, 3-4 times a day, or as directed by your health care provider. °· Only take over-the-counter or prescription medicines for pain, discomfort, or fever as directed by your caregiver. Your caregiver may recommend avoiding anti-inflammatory medicines (aspirin, ibuprofen, and naproxen) for 48 hours because these medicines may increase bruising. °· Rest the injured area. °· If possible, elevate the injured area to reduce swelling. °SEEK IMMEDIATE MEDICAL CARE IF:  °· You have increased bruising or swelling. °· You have pain that is getting worse. °· Your swelling or pain is not relieved with medicines. °MAKE SURE YOU:  °· Understand these instructions. °· Will watch your condition. °· Will get help right  away if you are not doing well or get worse. °Document Released: 03/29/2005 Document Revised: 06/24/2013 Document Reviewed: 04/24/2011 °[employer]® Patient Information ©2015 [employer]. This information is not intended to replace advice given to you by your health care provider. Make sure you discuss any questions you have with your health care provider. ° °
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
## 2015-04-06 ENCOUNTER — Encounter (HOSPITAL_COMMUNITY): Payer: Self-pay | Admitting: *Deleted

## 2015-04-06 ENCOUNTER — Inpatient Hospital Stay (HOSPITAL_COMMUNITY)
Admission: AD | Admit: 2015-04-06 | Discharge: 2015-04-06 | Disposition: A | Discharge status: Home / Self Care | Payer: 59 | Source: Ambulatory Visit | Attending: Obstetrics & Gynecology | Admitting: Obstetrics & Gynecology

## 2015-04-06 ENCOUNTER — Inpatient Hospital Stay (HOSPITAL_COMMUNITY): Payer: 59

## 2015-04-06 DIAGNOSIS — O209 Hemorrhage in early pregnancy, unspecified: Secondary | ICD-10-CM | POA: Diagnosis not present

## 2015-04-06 DIAGNOSIS — O039 Complete or unspecified spontaneous abortion without complication: Secondary | ICD-10-CM | POA: Insufficient documentation

## 2015-04-06 DIAGNOSIS — Z880 Allergy status to penicillin: Secondary | ICD-10-CM | POA: Diagnosis not present

## 2015-04-06 DIAGNOSIS — F17210 Nicotine dependence, cigarettes, uncomplicated: Secondary | ICD-10-CM | POA: Diagnosis not present

## 2015-04-06 LAB — URINALYSIS, ROUTINE W REFLEX MICROSCOPIC
Bilirubin Urine: NEGATIVE
Glucose, UA: NEGATIVE mg/dL
Ketones, ur: NEGATIVE mg/dL
Nitrite: NEGATIVE
PH: 7 (ref 5.0–8.0)
PROTEIN: 100 mg/dL — AB
Specific Gravity, Urine: 1.015 (ref 1.005–1.030)
Urobilinogen, UA: 1 mg/dL (ref 0.0–1.0)

## 2015-04-06 LAB — CBC
HCT: 34.6 % — ABNORMAL LOW (ref 36.0–46.0)
Hemoglobin: 11.5 g/dL — ABNORMAL LOW (ref 12.0–15.0)
MCH: 29.8 pg (ref 26.0–34.0)
MCHC: 33.2 g/dL (ref 30.0–36.0)
MCV: 89.6 fL (ref 78.0–100.0)
PLATELETS: 218 10*3/uL (ref 150–400)
RBC: 3.86 MIL/uL — ABNORMAL LOW (ref 3.87–5.11)
RDW: 13.2 % (ref 11.5–15.5)
WBC: 11.7 10*3/uL — ABNORMAL HIGH (ref 4.0–10.5)

## 2015-04-06 LAB — URINE MICROSCOPIC-ADD ON

## 2015-04-06 LAB — WET PREP, GENITAL
Clue Cells Wet Prep HPF POC: NONE SEEN
Trich, Wet Prep: NONE SEEN
WBC WET PREP: NONE SEEN
Yeast Wet Prep HPF POC: NONE SEEN

## 2015-04-06 LAB — HCG, QUANTITATIVE, PREGNANCY: hCG, Beta Chain, Quant, S: 12450 m[IU]/mL — ABNORMAL HIGH (ref <5)

## 2015-04-06 LAB — POCT PREGNANCY, URINE: Preg Test, Ur: POSITIVE — AB

## 2015-04-06 LAB — HIV ANTIBODY (ROUTINE TESTING W REFLEX): HIV SCREEN 4TH GENERATION: NONREACTIVE

## 2015-04-06 MED ORDER — LACTATED RINGERS IV SOLN
INTRAVENOUS | Status: DC
  Administered 2015-04-06: 10:00:00 via INTRAVENOUS

## 2015-04-06 MED ORDER — HYDROMORPHONE HCL 1 MG/ML IJ SOLN
0.5000 mg | Freq: Once | INTRAMUSCULAR | Status: AC
  Administered 2015-04-06: 0.5 mg via INTRAVENOUS
  Filled 2015-04-06: qty 1

## 2015-04-06 MED ORDER — OXYCODONE-ACETAMINOPHEN 5-325 MG PO TABS: 1.0000 | ORAL_TABLET | Freq: Four times a day (QID) | ORAL | Status: DC | PRN

## 2015-04-06 MED ORDER — MISOPROSTOL 200 MCG PO TABS: 400.0000 ug | ORAL_TABLET | Freq: Four times a day (QID) | ORAL | Status: DC

## 2015-04-06 MED ORDER — IBUPROFEN 600 MG PO TABS: 600.0000 mg | ORAL_TABLET | Freq: Four times a day (QID) | ORAL | Status: DC | PRN

## 2015-04-06 NOTE — MAU Note (Signed)
IV fluids restarted per order.

## 2015-04-06 NOTE — Progress Notes (Signed)
Wynelle Bourgeois CNM in to discuss test results and d/c plan with pt. Emotional support given

## 2015-04-06 NOTE — MAU Provider Note (Signed)
History     CSN: [id number]  Arrival date and time: 04/06/15 4098   First Provider Initiated Contact with Patient 04/06/15 0809      Chief Complaint  Patient presents with  . Vaginal Bleeding  . Abdominal Pain    This is a 24 y.o. female who presents via EMS after reportedly bleeding heavily and passing a large clot at home. Also has cramping. States she stopped using OCPS a few months ago and never restarted any birth control   States her doctor told her since she was allergic to PCN, that meant she was allergic to combination pills and switched her to the "breastfeeding pill".  Didn't like it and stopped it. Has had unprotected IC since then.   Vaginal Bleeding The patient's primary symptoms include missed menses (never had period after stopping pills), pelvic pain and vaginal bleeding. The patient's pertinent negatives include no genital itching, genital lesions or genital odor. This is a new problem. The current episode started today. The problem occurs constantly. The problem has been unchanged. The pain is moderate. The problem affects both sides. She is pregnant. Associated symptoms include abdominal pain. Pertinent negatives include no back pain, chills, constipation, diarrhea, dysuria, fever, nausea or vomiting. The vaginal discharge was bloody. The vaginal bleeding is heavier than menses. She has been passing clots. She has not been passing tissue. Nothing aggravates the symptoms. She has tried nothing for the symptoms. She is sexually active. It is unknown whether or not her partner has an STD. She uses nothing for contraception. Her menstrual history has been irregular.   RN Note:: Pt came in by EMS, started bleeding yesterday, thought it was her period. Woke up @ 0400 with extreme lower abd cramping. Pt got in shower, had large gush of blood, passed very large clot in tub. Pt was on oral contraceptive, stopped 3-4 months ago, has not had a period. IV in place on arrival, 20 g.  L forearm, bolus of NS infusing. Pt's robe had small amount of blood on it upon arrival, also small amount on EMS sheet. Large peri pad placed. OB History    Gravida Para Term Preterm AB TAB SAB Ectopic Multiple Living   Past Medical History  Diagnosis Date  . Tonsillitis, chronic   . No pertinent past medical history   . Medical history non-contributory     Past Surgical History  Procedure Laterality Date  . Tonsillectomy N/A 01/19/2014    Procedure: TONSILLECTOMY;  Surgeon: Christia Reading, MD;  Location: [facility];  Service: ENT;  Laterality: N/A;    Family History  Problem Relation Age of Onset  . Diabetes Mother     Social History  Substance Use Topics  . Smoking status: Light Tobacco Smoker -- 0.25 packs/day    Types: Cigarettes  . Smokeless tobacco: None  . Alcohol Use: 0.6 oz/week    1 Shots of liquor per week     Comment: social    Allergies:  Allergies  Allergen Reactions  . Penicillins Hives and Rash  . Sulfa Antibiotics Hives and Rash    Prescriptions prior to admission  Medication Sig Dispense Refill Last Dose  . ORSYTHIA 0.1-20 MG-MCG tablet Take 1 tablet by mouth daily.  3 11/19/2014 at 2200    Review of Systems  Constitutional: Negative for fever, chills and malaise/fatigue.  Gastrointestinal: Positive for abdominal pain. Negative for nausea, vomiting, diarrhea and  constipation.  Genitourinary: Positive for vaginal bleeding, pelvic pain and missed menses (never had period after stopping pills). Negative for dysuria.       Vaginal bleeding  Musculoskeletal: Negative for back pain.  Neurological: Negative for weakness.   Physical Exam   Blood pressure 114/58, pulse 66, temperature 97.9 F (36.6 C), temperature source Oral, resp. rate 18, last menstrual period 04/05/2015, SpO2 100 %.  Physical Exam  Constitutional: She is oriented to person, place, and time. She appears well-developed and well-nourished. No  distress.  HENT:  Head: Normocephalic.  Cardiovascular: Normal rate, regular rhythm and normal heart sounds.  Exam reveals no gallop and no friction rub.   No murmur heard. Respiratory: Effort normal.  GI: Soft. She exhibits no distension and no mass. There is tenderness (over uterus). There is no rebound and no guarding.  Genitourinary: Vaginal discharge (large blood in vault, cervix FT open, long and firm) found.  Uterus 10-12 week size  Musculoskeletal: Normal range of motion.  Neurological: She is alert and oriented to person, place, and time.  Skin: Skin is warm and dry.  Psychiatric: She has a normal mood and affect.    MAU Course  Procedures  MDM  This bleeding could represent a normal pregnancy with bleeding, spontaneous abortion or even an ectopic which can be life-threatening.   Cultures were done to rule out pelvic infection Blood drawn for Quant HCG, CBC, ABO/Rh  [place] ordered Pelvic done, cultures sent with wet prep   Assessment and Plan  A:  Pregnancy at unknown gestation      Complete abortion with retained clot in vagina and lower uterine segment         P:  Discussed with Dr Debroah Loop      Discharge home       Will Rx Cytotec for 24 hrs to help evacuate uterus       Well Rx Ibuprofen and limited Percocet for pain       Counseling and support offered       Follow up in clinic in 2-4 weeks for post AB care   [facility] 04/06/2015, 8:09 AM

## 2015-04-06 NOTE — MAU Note (Signed)
IV converted to saline lock

## 2015-04-06 NOTE — Discharge Instructions (Signed)
Pelvic Rest Pelvic rest is sometimes recommended for women when:   The placenta is partially or completely covering the opening of the cervix (placenta previa).  There is bleeding between the uterine wall and the amniotic sac in the first trimester (subchorionic hemorrhage).  The cervix begins to open without labor starting (incompetent cervix, cervical insufficiency).  The labor is too early (preterm labor). HOME CARE INSTRUCTIONS  Do not have sexual intercourse, stimulation, or an orgasm.  Do not use tampons, douche, or put anything in the vagina.  Do not lift anything over 10 pounds (4.5 kg).  Avoid strenuous activity or straining your pelvic muscles. SEEK MEDICAL CARE IF:  You have any vaginal bleeding during pregnancy. Treat this as a potential emergency.  You have cramping pain felt low in the stomach (stronger than menstrual cramps).  You notice vaginal discharge (watery, mucus, or bloody).  You have a low, dull backache.  There are regular contractions or uterine tightening. SEEK IMMEDIATE MEDICAL CARE IF: You have vaginal bleeding and have placenta previa.  Document Released: 10/14/2010 Document Revised: 09/11/2011 Document Reviewed: 10/14/2010 [facility] Patient Information 2015 [place], [place]. This information is not intended to replace advice given to you by your health care provider. Make sure you discuss any questions you have with your health care provider.  Incomplete Miscarriage A miscarriage is the sudden loss of an unborn baby (fetus) before the 20th week of pregnancy. In an incomplete miscarriage, parts of the fetus or placenta (afterbirth) remain in the body.  Having a miscarriage can be an emotional experience. Talk with your health care provider about any questions you may have about miscarrying, the grieving process, and your future pregnancy plans. CAUSES   Problems with the fetal chromosomes that make it impossible for the baby to develop normally.  Problems with the baby's genes or chromosomes are most often the result of errors that occur by chance as the embryo divides and grows. The problems are not inherited from the parents.  Infection of the cervix or uterus.  Hormone problems.  Problems with the cervix, such as having an incompetent cervix. This is when the tissue in the cervix is not strong enough to hold the pregnancy.  Problems with the uterus, such as an abnormally shaped uterus, uterine fibroids, or congenital abnormalities.  Certain medical conditions.  Smoking, drinking alcohol, or taking illegal drugs.  Trauma. SYMPTOMS   Vaginal bleeding or spotting, with or without cramps or pain.  Pain or cramping in the abdomen or lower back.  Passing fluid, tissue, or blood clots from the vagina. DIAGNOSIS  Your health care provider will perform a physical exam. You may also have an ultrasound to confirm the miscarriage. Blood or urine tests may also be ordered. TREATMENT   Usually, a dilation and curettage (D&C) procedure is performed. During a D&C procedure, the cervix is widened (dilated) and any remaining fetal or placental tissue is gently removed from the uterus.  Antibiotic medicines are prescribed if there is an infection. Other medicines may be given to reduce the size of the uterus (contract) if there is a lot of bleeding.  If you have Rh negative blood and your baby was Rh positive, you will need a Rho (D) immune globulin shot. This shot will protect any future baby from having Rh blood problems in future pregnancies.  You may be confined to bed rest. This means you should stay in bed and only get up to use the bathroom. HOME CARE INSTRUCTIONS   Rest as  directed by your health care provider.  Restrict activity as directed by your health care provider. You may be allowed to continue light activity if curettage was not done but you require further treatment.  Keep track of the number of pads you use each day.  Keep track of how soaked (saturated) they are. Record this information.  Do not  use tampons.  Do not douche or have sexual intercourse until approved by your health care provider.  Keep all follow-up appointments for reevaluation and continuing management.  Only take over-the-counter or prescription medicines for pain, fever, or discomfort as directed by your health care provider.  Take antibiotic medicine as directed by your health care provider. Make sure you finish it even if you start to feel better. SEEK IMMEDIATE MEDICAL CARE IF:   You experience severe cramps in your stomach, back, or abdomen.  You have an unexplained temperature (make sure to record these temperatures).  You pass large clots or tissue (save these for your health care provider to inspect).  Your bleeding increases.  You become light-headed, weak, or have fainting episodes. MAKE SURE YOU:   Understand these instructions.  Will watch your condition.  Will get help right away if you are not doing well or get worse.   Document Released: 06/19/2005 Document Revised: 11/03/2013 Document Reviewed: 01/16/2013 [facility] Patient Information 2015 [place], [place]. This information is not intended to replace advice given to you by your health care provider. Make sure you discuss any questions you have with your health care provider.

## 2015-04-06 NOTE — MAU Note (Addendum)
Pt came in by EMS, started bleeding yesterday, thought it was her period.  Woke up @ 0400 with extreme lower abd cramping.  Pt got in shower, had large gush of blood, passed very large clot in tub.  Pt was on oral contraceptive, stopped 3-4 months ago, has not had a period.  IV in place on arrival, 20 g. L forearm, bolus of NS infusing.  Pt's robe had small amount of blood on it upon arrival, also small amount on EMS sheet.  Large peri pad placed.

## 2015-04-07 LAB — GC/CHLAMYDIA PROBE AMP (~~LOC~~) NOT AT ARMC
Chlamydia: NEGATIVE
Neisseria Gonorrhea: NEGATIVE

## 2015-05-03 ENCOUNTER — Encounter: Payer: 59 | Admitting: Obstetrics & Gynecology

## 2015-05-06 ENCOUNTER — Encounter: Payer: Self-pay | Admitting: Advanced Practice Midwife

## 2015-05-06 ENCOUNTER — Ambulatory Visit (INDEPENDENT_AMBULATORY_CARE_PROVIDER_SITE_OTHER): Payer: 59 | Admitting: Advanced Practice Midwife

## 2015-05-06 VITALS — BP 113/60 | HR 58 | Temp 98.5°F | Ht 66.0 in | Wt 250.9 lb

## 2015-05-06 DIAGNOSIS — O039 Complete or unspecified spontaneous abortion without complication: Secondary | ICD-10-CM | POA: Diagnosis not present

## 2015-05-06 LAB — HCG, QUANTITATIVE, PREGNANCY

## 2015-05-06 MED ORDER — NORGESTIMATE-ETH ESTRADIOL 0.25-35 MG-MCG PO TABS: 1.0000 | ORAL_TABLET | Freq: Every day | ORAL | Status: DC

## 2015-05-06 NOTE — Progress Notes (Signed)
  Subjective:     Lorraine Rogers is a 24 y.o. female who presents for a postpartum visit. She is 4 weeks postpartum following a SAB. I have fully reviewed the prenatal and intrapartum course. The delivery was at 4 gestational weeks. Outcome: spontaneous abortion. Anesthesia: none. . Bleeding no bleeding and Just started period today. Bowel function is normal. Bladder function is normal. Patient is not sexually active. Contraception method is none. Postpartum depression screening: negative.  The following portions of the patient's history were reviewed and updated as appropriate: allergies, current medications, past family history, past medical history, past social history, past surgical history and problem list.  Review of Systems Pertinent items are noted in HPI.   Objective:    BP 113/60 mmHg  Pulse 58  Temp(Src) 98.5 F (36.9 C) (Oral)  Ht 5\' 6"  (1.676 m)  Wt 250 lb 14.4 oz (113.807 kg)  BMI 40.52 kg/m2  LMP 04/05/2015  Breastfeeding? Unknown  General:  alert, cooperative and no distress   Breasts:  inspection negative, no nipple discharge or bleeding, no masses or nodularity palpable  Lungs: Resp unlabored  Heart:  rate normal  Abdomen: soft, non-tender; bowel sounds normal; no masses,  no organomegaly   Vulva:  not evaluated  Vagina: not evaluated  Cervix:  n/a  Corpus: not examined  Adnexa:  not evaluated  Rectal Exam: Not performed.        Assessment:     Normal  Post SAB exam. Pap smear not done at today's visit.   Plan:    1. Contraception: OCP (estrogen/progesterone) 2. Rx Sprintec 3. Follow up in: 1 month or as needed.   4. Final HCG drawn   If > 2, will repeat weekly until 0

## 2015-05-06 NOTE — Patient Instructions (Signed)
Miscarriage  A miscarriage is the sudden loss of an unborn baby (fetus) before the 20th week of pregnancy. Most miscarriages happen in the first 3 months of pregnancy. Sometimes, it happens before a woman even knows she is pregnant. A miscarriage is also called a "spontaneous miscarriage" or "early pregnancy loss." Having a miscarriage can be an emotional experience. Talk with your caregiver about any questions you may have about miscarrying, the grieving process, and your future pregnancy plans.  CAUSES    Problems with the fetal chromosomes that make it impossible for the baby to develop normally. Problems with the baby's genes or chromosomes are most often the result of errors that occur, by chance, as the embryo divides and grows. The problems are not inherited from the parents.   Infection of the cervix or uterus.    Hormone problems.    Problems with the cervix, such as having an incompetent cervix. This is when the tissue in the cervix is not strong enough to hold the pregnancy.    Problems with the uterus, such as an abnormally shaped uterus, uterine fibroids, or congenital abnormalities.    Certain medical conditions.    Smoking, drinking alcohol, or taking illegal drugs.    Trauma.   Often, the cause of a miscarriage is unknown.   SYMPTOMS    Vaginal bleeding or spotting, with or without cramps or pain.   Pain or cramping in the abdomen or lower back.   Passing fluid, tissue, or blood clots from the vagina.  DIAGNOSIS   Your caregiver will perform a physical exam. You may also have an ultrasound to confirm the miscarriage. Blood or urine tests may also be ordered.  TREATMENT    Sometimes, treatment is not necessary if you naturally pass all the fetal tissue that was in the uterus. If some of the fetus or placenta remains in the body (incomplete miscarriage), tissue left behind may become infected and must be removed. Usually, a dilation and curettage (D and C) procedure is performed.  During a D and C procedure, the cervix is widened (dilated) and any remaining fetal or placental tissue is gently removed from the uterus.   Antibiotic medicines are prescribed if there is an infection. Other medicines may be given to reduce the size of the uterus (contract) if there is a lot of bleeding.   If you have Rh negative blood and your baby was Rh positive, you will need a Rh immunoglobulin shot. This shot will protect any future baby from having Rh blood problems in future pregnancies.  HOME CARE INSTRUCTIONS    Your caregiver may order bed rest or may allow you to continue light activity. Resume activity as directed by your caregiver.   Have someone help with home and family responsibilities during this time.    Keep track of the number of sanitary pads you use each day and how soaked (saturated) they are. Write down this information.    Do not use tampons. Do not douche or have sexual intercourse until approved by your caregiver.    Only take over-the-counter or prescription medicines for pain or discomfort as directed by your caregiver.    Do not take aspirin. Aspirin can cause bleeding.    Keep all follow-up appointments with your caregiver.    If you or your partner have problems with grieving, talk to your caregiver or seek counseling to help cope with the pregnancy loss. Allow enough time to grieve before trying to get pregnant again.     SEEK IMMEDIATE MEDICAL CARE IF:    You have severe cramps or pain in your back or abdomen.   You have a fever.   You pass large blood clots (walnut-sized or larger) ortissue from your vagina. Save any tissue for your caregiver to inspect.    Your bleeding increases.    You have a thick, bad-smelling vaginal discharge.   You become lightheaded, weak, or you faint.    You have chills.   MAKE SURE YOU:   Understand these instructions.   Will watch your condition.   Will get help right away if you are not doing well or get worse.     This  information is not intended to replace advice given to you by your health care provider. Make sure you discuss any questions you have with your health care provider.     Document Released: 12/13/2000 Document Revised: 10/14/2012 Document Reviewed: 08/08/2011  [employer] Interactive Patient Education 2016 [employer]

## 2015-07-04 NOTE — L&D Delivery Note (Signed)
   Delivery Note  Over one push, At 5:28 AM a viable female was delivered via  (Presentation: ;LOA ) EN [place].  The bag was peeled off the face immdiately after birth. .  APGAR: ,7/9 ; weight    Pending.  After 2 minutes, the cord was clamped and cut. 40 units of pitocin diluted in 1000cc LR was infused rapidly IV.  The placenta separated spontaneously and delivered via CCT and maternal pushing effort.  It was inspected and appears to be intact with a 3 VC.   Anesthesia:  epidurla Episiotomy:  none Lacerations:  none Suture Repair:  Est. Blood Loss (mL):  75 Mom to postpartum.  Baby to Couplet care / Skin to Skin.  All by Dr. Sampson GoonFitzgerald under my direct supervision.   Lorraine Rogers,Lorraine Rogers 05/26/2016, 5:37 AM

## 2015-10-06 ENCOUNTER — Encounter (HOSPITAL_COMMUNITY): Payer: Self-pay | Admitting: Advanced Practice Midwife

## 2015-10-06 ENCOUNTER — Inpatient Hospital Stay (HOSPITAL_COMMUNITY)
Admission: AD | Admit: 2015-10-06 | Discharge: 2015-10-06 | Disposition: A | Discharge status: Home / Self Care | Payer: 59 | Source: Ambulatory Visit | Attending: Obstetrics and Gynecology | Admitting: Obstetrics and Gynecology

## 2015-10-06 DIAGNOSIS — N912 Amenorrhea, unspecified: Secondary | ICD-10-CM | POA: Diagnosis not present

## 2015-10-06 NOTE — MAU Note (Addendum)
Family had noted that her sleeping pattern had changed.  Told her to take a preg test, she did and it was positve.  Wanting confirmation. No problems, no pain  Or bleeding.

## 2015-10-06 NOTE — MAU Provider Note (Signed)
[name] is a 25 y.o. G2P1011 at Unknown who presents to MAU today for pregnancy verification. The patient denies abdominal pain or vaginal bleeding today.   BP 140/70 mmHg  Pulse 78  Temp(Src) 98.8 F (37.1 C) (Oral)  Resp 16  Ht 5\' 6"  (1.676 m)  Wt 243 lb (110.224 kg)  BMI 39.24 kg/m2  LMP 08/22/2015  CONSTITUTIONAL: Well-developed, well-nourished female in no acute distress.  CARDIOVASCULAR: Regular heart rate RESPIRATORY: Normal effort NEUROLOGICAL: Alert and oriented to person, place, and time.  SKIN: Skin is warm and dry. No rash noted. Not diaphoretic. No erythema. No pallor. PSYCH: Normal mood and affect. Normal behavior. Normal judgment and thought content.  MDM Medical Screen Exam Complete  A: Amenorrhea   P: Discharge from MAU Patient advised to follow-up with [facility] for pregnancy confirmation  Monday-Thursday 8am-4pm or Friday 8am-11am Patient may return to MAU as needed or if her condition were to change or worsen   Hurshel PartyLisa A Leftwich-Kirby, CNM  10/06/2015 7:41 PM

## 2015-10-07 ENCOUNTER — Ambulatory Visit (INDEPENDENT_AMBULATORY_CARE_PROVIDER_SITE_OTHER): Payer: 59

## 2015-10-07 ENCOUNTER — Encounter: Payer: Self-pay | Admitting: Family Medicine

## 2015-10-07 DIAGNOSIS — Z3201 Encounter for pregnancy test, result positive: Secondary | ICD-10-CM | POA: Diagnosis not present

## 2015-10-07 LAB — POCT PREGNANCY, URINE
Preg Test, Ur: POSITIVE — AB
Preg Test, Ur: POSITIVE — AB

## 2015-10-07 NOTE — Progress Notes (Signed)
Pt here today for pregnancy test.  Resulted positive.  Proof of pregnancy provided; pt will be going to another ob office for prenatal care.

## 2015-10-28 ENCOUNTER — Inpatient Hospital Stay (HOSPITAL_COMMUNITY)
Admission: AD | Admit: 2015-10-28 | Discharge: 2015-10-28 | Disposition: A | Discharge status: Home / Self Care | Payer: 59 | Source: Ambulatory Visit | Attending: Obstetrics & Gynecology | Admitting: Obstetrics & Gynecology

## 2015-10-28 ENCOUNTER — Encounter (HOSPITAL_COMMUNITY): Payer: Self-pay | Admitting: *Deleted

## 2015-10-28 ENCOUNTER — Inpatient Hospital Stay (HOSPITAL_COMMUNITY): Payer: 59

## 2015-10-28 DIAGNOSIS — Z3490 Encounter for supervision of normal pregnancy, unspecified, unspecified trimester: Secondary | ICD-10-CM

## 2015-10-28 DIAGNOSIS — Z3A01 Less than 8 weeks gestation of pregnancy: Secondary | ICD-10-CM | POA: Insufficient documentation

## 2015-10-28 DIAGNOSIS — Z880 Allergy status to penicillin: Secondary | ICD-10-CM | POA: Diagnosis not present

## 2015-10-28 DIAGNOSIS — O98311 Other infections with a predominantly sexual mode of transmission complicating pregnancy, first trimester: Secondary | ICD-10-CM | POA: Diagnosis not present

## 2015-10-28 DIAGNOSIS — Z87891 Personal history of nicotine dependence: Secondary | ICD-10-CM | POA: Insufficient documentation

## 2015-10-28 DIAGNOSIS — A5901 Trichomonal vulvovaginitis: Secondary | ICD-10-CM | POA: Insufficient documentation

## 2015-10-28 DIAGNOSIS — Z882 Allergy status to sulfonamides status: Secondary | ICD-10-CM | POA: Diagnosis not present

## 2015-10-28 DIAGNOSIS — O26891 Other specified pregnancy related conditions, first trimester: Secondary | ICD-10-CM | POA: Insufficient documentation

## 2015-10-28 DIAGNOSIS — R102 Pelvic and perineal pain: Secondary | ICD-10-CM | POA: Insufficient documentation

## 2015-10-28 DIAGNOSIS — O23591 Infection of other part of genital tract in pregnancy, first trimester: Secondary | ICD-10-CM | POA: Diagnosis not present

## 2015-10-28 DIAGNOSIS — R109 Unspecified abdominal pain: Secondary | ICD-10-CM

## 2015-10-28 DIAGNOSIS — O26899 Other specified pregnancy related conditions, unspecified trimester: Secondary | ICD-10-CM

## 2015-10-28 HISTORY — DX: Trichomonal vulvovaginitis: A59.01

## 2015-10-28 LAB — URINALYSIS, ROUTINE W REFLEX MICROSCOPIC
BILIRUBIN URINE: NEGATIVE
Glucose, UA: NEGATIVE mg/dL
HGB URINE DIPSTICK: NEGATIVE
Ketones, ur: 15 mg/dL — AB
Nitrite: POSITIVE — AB
PROTEIN: NEGATIVE mg/dL
Specific Gravity, Urine: 1.02 (ref 1.005–1.030)
pH: 6.5 (ref 5.0–8.0)

## 2015-10-28 LAB — CBC
HCT: 34 % — ABNORMAL LOW (ref 36.0–46.0)
Hemoglobin: 11.5 g/dL — ABNORMAL LOW (ref 12.0–15.0)
MCH: 29.6 pg (ref 26.0–34.0)
MCHC: 33.8 g/dL (ref 30.0–36.0)
MCV: 87.6 fL (ref 78.0–100.0)
PLATELETS: 265 10*3/uL (ref 150–400)
RBC: 3.88 MIL/uL (ref 3.87–5.11)
RDW: 14.2 % (ref 11.5–15.5)
WBC: 15 10*3/uL — AB (ref 4.0–10.5)

## 2015-10-28 LAB — URINE MICROSCOPIC-ADD ON: RBC / HPF: NONE SEEN RBC/hpf (ref 0–5)

## 2015-10-28 LAB — HCG, QUANTITATIVE, PREGNANCY: hCG, Beta Chain, Quant, S: 217120 m[IU]/mL — ABNORMAL HIGH (ref <5)

## 2015-10-28 LAB — WET PREP, GENITAL
CLUE CELLS WET PREP: NONE SEEN
SPERM: NONE SEEN
YEAST WET PREP: NONE SEEN

## 2015-10-28 MED ORDER — PROMETHAZINE HCL 25 MG PO TABS: 12.5000 mg | ORAL_TABLET | Freq: Four times a day (QID) | ORAL | Status: DC | PRN

## 2015-10-28 MED ORDER — METRONIDAZOLE 500 MG PO TABS
2000.0000 mg | ORAL_TABLET | Freq: Once | ORAL | Status: AC
  Administered 2015-10-28: 2000 mg via ORAL
  Filled 2015-10-28: qty 4

## 2015-10-28 NOTE — Discharge Instructions (Signed)
Safe Medications in Pregnancy   Acne: Benzoyl Peroxide Salicylic Acid  Backache/Headache: Tylenol: 2 regular strength every 4 hours OR              2 Extra strength every 6 hours  Colds/Coughs/Allergies: Benadryl (alcohol free) 25 mg every 6 hours as needed Breath right strips Claritin Cepacol throat lozenges Chloraseptic throat spray Cold-Eeze- up to three times per day Cough drops, alcohol free Flonase (by prescription only) Guaifenesin Mucinex Robitussin DM (plain only, alcohol free) Saline nasal spray/drops Sudafed (pseudoephedrine) & Actifed ** use only after [redacted] weeks gestation and if you do not have high blood pressure Tylenol Vicks Vaporub Zinc lozenges Zyrtec   Constipation: Colace Ducolax suppositories Fleet enema Glycerin suppositories Metamucil Milk of magnesia Miralax Senokot Smooth move tea  Diarrhea: Kaopectate Imodium A-D  *NO pepto Bismol  Hemorrhoids: Anusol Anusol HC Preparation H Tucks  Indigestion: Tums Maalox Mylanta Zantac  Pepcid  Insomnia: Benadryl (alcohol free) 25mg every 6 hours as needed Tylenol PM Unisom, no Gelcaps  Leg Cramps: Tums MagGel  Nausea/Vomiting:  Bonine Dramamine Emetrol Ginger extract Sea bands Meclizine  Nausea medication to take during pregnancy:  Unisom (doxylamine succinate 25 mg tablets) Take one tablet daily at bedtime. If symptoms are not adequately controlled, the dose can be increased to a maximum recommended dose of two tablets daily (1/2 tablet in the morning, 1/2 tablet mid-afternoon and one at bedtime). Vitamin B6 100mg tablets. Take one tablet twice a day (up to 200 mg per day).  Skin Rashes: Aveeno products Benadryl cream or 25mg every 6 hours as needed Calamine Lotion 1% cortisone cream  Yeast infection: Gyne-lotrimin 7 Monistat 7   **If taking multiple medications, please check labels to avoid duplicating the same active ingredients **take medication as directed on  the label ** Do not exceed 4000 mg of tylenol in 24 hours **Do not take medications that contain aspirin or ibuprofen    First Trimester of Pregnancy The first trimester of pregnancy is from week 1 until the end of week 12 (months 1 through 3). A week after a sperm fertilizes an egg, the egg will implant on the wall of the uterus. This embryo will begin to develop into a baby. Genes from you and your partner are forming the baby. The female genes determine whether the baby is a boy or a girl. At 6-8 weeks, the eyes and face are formed, and the heartbeat can be seen on ultrasound. At the end of 12 weeks, all the baby's organs are formed.  Now that you are pregnant, you will want to do everything you can to have a healthy baby. Two of the most important things are to get good prenatal care and to follow your health care provider's instructions. Prenatal care is all the medical care you receive before the baby's birth. This care will help prevent, find, and treat any problems during the pregnancy and childbirth. BODY CHANGES Your body goes through many changes during pregnancy. The changes vary from woman to woman.   You may gain or lose a couple of pounds at first.  You may feel sick to your stomach (nauseous) and throw up (vomit). If the vomiting is uncontrollable, call your health care provider.  You may tire easily.  You may develop headaches that can be relieved by medicines approved by your health care provider.  You may urinate more often. Painful urination may mean you have a bladder infection.  You may develop heartburn as a result of your   pregnancy.  You may develop constipation because certain hormones are causing the muscles that push waste through your intestines to slow down.  You may develop hemorrhoids or swollen, bulging veins (varicose veins).  Your breasts may begin to grow larger and become tender. Your nipples may stick out more, and the tissue that surrounds them (areola)  may become darker.  Your gums may bleed and may be sensitive to brushing and flossing.  Dark spots or blotches (chloasma, mask of pregnancy) may develop on your face. This will likely fade after the baby is born.  Your menstrual periods will stop.  You may have a loss of appetite.  You may develop cravings for certain kinds of food.  You may have changes in your emotions from day to day, such as being excited to be pregnant or being concerned that something may go wrong with the pregnancy and baby.  You may have more vivid and strange dreams.  You may have changes in your hair. These can include thickening of your hair, rapid growth, and changes in texture. Some women also have hair loss during or after pregnancy, or hair that feels dry or thin. Your hair will most likely return to normal after your baby is born. WHAT TO EXPECT AT YOUR PRENATAL VISITS During a routine prenatal visit:  You will be weighed to make sure you and the baby are growing normally.  Your blood pressure will be taken.  Your abdomen will be measured to track your baby's growth.  The fetal heartbeat will be listened to starting around week 10 or 12 of your pregnancy.  Test results from any previous visits will be discussed. Your health care provider may ask you:  How you are feeling.  If you are feeling the baby move.  If you have had any abnormal symptoms, such as leaking fluid, bleeding, severe headaches, or abdominal cramping.  If you are using any tobacco products, including cigarettes, chewing tobacco, and electronic cigarettes.  If you have any questions. Other tests that may be performed during your first trimester include:  Blood tests to find your blood type and to check for the presence of any previous infections. They will also be used to check for low iron levels (anemia) and Rh antibodies. Later in the pregnancy, blood tests for diabetes will be done along with other tests if problems  develop.  Urine tests to check for infections, diabetes, or protein in the urine.  An ultrasound to confirm the proper growth and development of the baby.  An amniocentesis to check for possible genetic problems.  Fetal screens for spina bifida and Down syndrome.  You may need other tests to make sure you and the baby are doing well.  HIV (human immunodeficiency virus) testing. Routine prenatal testing includes screening for HIV, unless you choose not to have this test. HOME CARE INSTRUCTIONS  Medicines  Follow your health care provider's instructions regarding medicine use. Specific medicines may be either safe or unsafe to take during pregnancy.  Take your prenatal vitamins as directed.  If you develop constipation, try taking a stool softener if your health care provider approves. Diet  Eat regular, well-balanced meals. Choose a variety of foods, such as meat or vegetable-based protein, fish, milk and low-fat dairy products, vegetables, fruits, and whole grain breads and cereals. Your health care provider will help you determine the amount of weight gain that is right for you.  Avoid raw meat and uncooked cheese. These carry germs that can cause   birth defects in the baby.  Eating four or five small meals rather than three large meals a day may help relieve nausea and vomiting. If you start to feel nauseous, eating a few soda crackers can be helpful. Drinking liquids between meals instead of during meals also seems to help nausea and vomiting.  If you develop constipation, eat more high-fiber foods, such as fresh vegetables or fruit and whole grains. Drink enough fluids to keep your urine clear or pale yellow. Activity and Exercise  Exercise only as directed by your health care provider. Exercising will help you:  Control your weight.  Stay in shape.  Be prepared for labor and delivery.  Experiencing pain or cramping in the lower abdomen or low back is a good sign that you  should stop exercising. Check with your health care provider before continuing normal exercises.  Try to avoid standing for long periods of time. Move your legs often if you must stand in one place for a long time.  Avoid heavy lifting.  Wear low-heeled shoes, and practice good posture.  You may continue to have sex unless your health care provider directs you otherwise. Relief of Pain or Discomfort  Wear a good support bra for breast tenderness.   Take warm sitz baths to soothe any pain or discomfort caused by hemorrhoids. Use hemorrhoid cream if your health care provider approves.   Rest with your legs elevated if you have leg cramps or low back pain.  If you develop varicose veins in your legs, wear support hose. Elevate your feet for 15 minutes, 3-4 times a day. Limit salt in your diet. Prenatal Care  Schedule your prenatal visits by the twelfth week of pregnancy. They are usually scheduled monthly at first, then more often in the last 2 months before delivery.  Write down your questions. Take them to your prenatal visits.  Keep all your prenatal visits as directed by your health care provider. Safety  Wear your seat belt at all times when driving.  Make a list of emergency phone numbers, including numbers for family, friends, the hospital, and police and fire departments. General Tips  Ask your health care provider for a referral to a local prenatal education class. Begin classes no later than at the beginning of month 6 of your pregnancy.  Ask for help if you have counseling or nutritional needs during pregnancy. Your health care provider can offer advice or refer you to specialists for help with various needs.  Do not use hot tubs, steam rooms, or saunas.  Do not douche or use tampons or scented sanitary pads.  Do not cross your legs for long periods of time.  Avoid cat litter boxes and soil used by cats. These carry germs that can cause birth defects in the baby  and possibly loss of the fetus by miscarriage or stillbirth.  Avoid all smoking, herbs, alcohol, and medicines not prescribed by your health care provider. Chemicals in these affect the formation and growth of the baby.  Do not use any tobacco products, including cigarettes, chewing tobacco, and electronic cigarettes. If you need help quitting, ask your health care provider. You may receive counseling support and other resources to help you quit.  Schedule a dentist appointment. At home, brush your teeth with a soft toothbrush and be gentle when you floss. SEEK MEDICAL CARE IF:   You have dizziness.  You have mild pelvic cramps, pelvic pressure, or nagging pain in the abdominal area.  You have persistent   nausea, vomiting, or diarrhea.  You have a bad smelling vaginal discharge.  You have pain with urination.  You notice increased swelling in your face, hands, legs, or ankles. SEEK IMMEDIATE MEDICAL CARE IF:   You have a fever.  You are leaking fluid from your vagina.  You have spotting or bleeding from your vagina.  You have severe abdominal cramping or pain.  You have rapid weight gain or loss.  You vomit blood or material that looks like coffee grounds.  You are exposed to German measles and have never had them.  You are exposed to fifth disease or chickenpox.  You develop a severe headache.  You have shortness of breath.  You have any kind of trauma, such as from a fall or a car accident.   This information is not intended to replace advice given to you by your health care provider. Make sure you discuss any questions you have with your health care provider.   Document Released: 06/13/2001 Document Revised: 07/10/2014 Document Reviewed: 04/29/2013 Elsevier Interactive Patient Education 2016 [employer].  

## 2015-10-28 NOTE — MAU Provider Note (Signed)
History     CSN: [id number]  Arrival date and time: 10/28/15 1744   None     Chief Complaint  Patient presents with  . Pelvic Pain   Pelvic Pain The patient's primary symptoms include genital itching and pelvic pain. This is a new problem. The current episode started in the past 7 days. The problem occurs intermittently. The problem has been unchanged. Pain severity now: 3/10  The problem affects both sides. She is pregnant. Associated symptoms include abdominal pain, constipation, nausea and vomiting. Pertinent negatives include no chills, diarrhea (alternating soft stools and constipation. ), dysuria, fever, frequency or urgency. The vaginal discharge was white. The vaginal bleeding is spotting. She has not been passing clots. She has not been passing tissue. Nothing aggravates the symptoms. She has tried nothing for the symptoms. She is sexually active. It is possible that her partner has an STD. She uses nothing for contraception. Menstrual history: LMP: 09/08/15     Past Medical History  Diagnosis Date  . Tonsillitis, chronic   . No pertinent past medical history   . Trichomoniasis of vagina     Past Surgical History  Procedure Laterality Date  . Tonsillectomy N/A 01/19/2014    Procedure: TONSILLECTOMY;  Surgeon: Christia Reading, MD;  Location: [facility];  Service: ENT;  Laterality: N/A;    Family History  Problem Relation Age of Onset  . Diabetes Mother     Social History  Substance Use Topics  . Smoking status: Former Smoker -- 0.25 packs/day    Types: Cigarettes    Quit date: 10/21/2015  . Smokeless tobacco: None  . Alcohol Use: 0.6 oz/week    1 Shots of liquor per week     Comment: social    Allergies:  Allergies  Allergen Reactions  . Penicillins Hives and Rash    Has patient had a PCN reaction causing immediate rash, facial/tongue/throat swelling, SOB or lightheadedness with hypotension: Yes Has patient had a PCN reaction causing severe rash  involving mucus membranes or skin necrosis: No Has patient had a PCN reaction that required hospitalization Yes Has patient had a PCN reaction occurring within the last 10 years: Yes If all of the above answers are "NO", then may proceed with Cephalosporin use.  . Sulfa Antibiotics Hives and Rash    Prescriptions prior to admission  Medication Sig Dispense Refill Last Dose  . norgestimate-ethinyl estradiol (ORTHO-CYCLEN,SPRINTEC,PREVIFEM) 0.25-35 MG-MCG tablet Take 1 tablet by mouth daily. (Patient not taking: Reported on 10/28/2015) 1 Package 11 Not Taking at Unknown time    Review of Systems  Constitutional: Negative for fever and chills.  Gastrointestinal: Positive for nausea, vomiting, abdominal pain and constipation. Negative for diarrhea (alternating soft stools and constipation. ).  Genitourinary: Positive for pelvic pain. Negative for dysuria, urgency and frequency.   Physical Exam   Blood pressure 120/58, pulse 71, temperature 98.5 F (36.9 C), temperature source Oral, resp. rate 16, height  (1.702 m), weight 110.043 kg (242 lb 9.6 oz), last menstrual period 08/22/2015, unknown if currently breastfeeding.  Physical Exam  Nursing note and vitals reviewed. Constitutional: She is oriented to person, place, and time. She appears well-developed and well-nourished. No distress.  HENT:  Head: Normocephalic.  Cardiovascular: Normal rate.   Respiratory: Effort normal.  GI: Soft. There is no tenderness. There is no rebound.  Genitourinary:   External: no lesion Vagina: large amount of tan discharge    Neurological: She is alert and oriented to person, place, and time.  Skin: Skin is warm and dry.  Psychiatric: She has a normal mood and affect.   Results for orders placed or performed during the hospital encounter of 10/28/15 (from the past 24 hour(s))  Urinalysis, Routine w reflex microscopic (not [facility])     Status: Abnormal   Collection Time: 10/28/15  6:05 PM  Result  Value Ref Range   Color, Urine YELLOW YELLOW   APPearance HAZY (A) CLEAR   Specific Gravity, Urine 1.020 1.005 - 1.030   pH 6.5 5.0 - 8.0   Glucose, UA NEGATIVE NEGATIVE mg/dL   Hgb urine dipstick NEGATIVE NEGATIVE   Bilirubin Urine NEGATIVE NEGATIVE   Ketones, ur 15 (A) NEGATIVE mg/dL   Protein, ur NEGATIVE NEGATIVE mg/dL   Nitrite POSITIVE (A) NEGATIVE   Leukocytes, UA SMALL (A) NEGATIVE  Urine microscopic-add on     Status: Abnormal   Collection Time: 10/28/15  6:05 PM  Result Value Ref Range   Squamous Epithelial / LPF 6-30 (A) NONE SEEN   WBC, UA 6-30 0 - 5 WBC/hpf   RBC / HPF NONE SEEN 0 - 5 RBC/hpf   Bacteria, UA MANY (A) NONE SEEN   Urine-Other TRICHOMONAS PRESENT   CBC     Status: Abnormal   Collection Time: 10/28/15  6:30 PM  Result Value Ref Range   WBC 15.0 (H) 4.0 - 10.5 K/uL   RBC 3.88 3.87 - 5.11 MIL/uL   Hemoglobin 11.5 (L) 12.0 - 15.0 g/dL   HCT 16.134.0 (L) 09.636.0 - 04.546.0 %   MCV 87.6 78.0 - 100.0 fL   MCH 29.6 26.0 - 34.0 pg   MCHC 33.8 30.0 - 36.0 g/dL   RDW 40.914.2 81.111.5 - 91.415.5 %   Platelets 265 150 - 400 K/uL  hCG, quantitative, pregnancy     Status: Abnormal   Collection Time: 10/28/15  6:30 PM  Result Value Ref Range   hCG, Beta Chain, Quant, S 217120 (H) <5 mIU/mL   [place] Ob Comp Less 14 Wks  10/28/2015  CLINICAL DATA:  Abdominal pain for 1 day. 1 episode of spotting. Pregnant patient. Patient is 9 weeks and 4 days pregnant based on the last menstrual period. EXAM: OBSTETRIC <14 WK US AND TRANSVAGINAL OB US TECHNIQUE: Both transabdominal and transvaginal ultrasound examinations were performed for complete evaluation of the gestation as well as the maternal uterus, adnexal regions, and pelvic cul-de-sac. Transvaginal technique was performed to assess early pregnancy. COMPARISON:  None. FINDINGS: Intrauterine gestational sac: Yes Yolk sac:  Yes Embryo:  Yes Cardiac Activity: Yes Heart Rate: 157  bpm CRL:  12.9  mm   7 w   4 d                  US EDC: 06/11/2016  Subchorionic hemorrhage: Small subchronic hemorrhage along the inferior margin of the gestational sac. Maternal uterus/adnexae: No uterine masses. Cervix is closed. Normal ovaries and adnexa. Trace pelvic free fluid. IMPRESSION: 1. Single live intrauterine pregnancy with a measured gestational age is 7 weeks and 4 days. 2. Small subchronic hemorrhage. No other evidence of a pregnancy complication. 3. Normal ovaries and adnexa. Electronically Signed   By: Amie Portlandavid  Ormond M.D.   On: 10/28/2015 19:50   [place] Ob Transvaginal  10/28/2015  CLINICAL DATA:  Abdominal pain for 1 day. 1 episode of spotting. Pregnant patient. Patient is 9 weeks and 4 days pregnant based on the last menstrual period. EXAM: OBSTETRIC <14 WK US AND TRANSVAGINAL OB US TECHNIQUE: Both transabdominal and  transvaginal ultrasound examinations were performed for complete evaluation of the gestation as well as the maternal uterus, adnexal regions, and pelvic cul-de-sac. Transvaginal technique was performed to assess early pregnancy. COMPARISON:  None. FINDINGS: Intrauterine gestational sac: Yes Yolk sac:  Yes Embryo:  Yes Cardiac Activity: Yes Heart Rate: 157  bpm CRL:  12.9  mm   7 w   4 d                  [place] EDC: 06/11/2016 Subchorionic hemorrhage: Small subchronic hemorrhage along the inferior margin of the gestational sac. Maternal uterus/adnexae: No uterine masses. Cervix is closed. Normal ovaries and adnexa. Trace pelvic free fluid. IMPRESSION: 1. Single live intrauterine pregnancy with a measured gestational age is 7 weeks and 4 days. 2. Small subchronic hemorrhage. No other evidence of a pregnancy complication. 3. Normal ovaries and adnexa. Electronically Signed   By: Amie Portland M.D.   On: 10/28/2015 19:50    MAU Course  Procedures  MDM Treated with 2g flagyl here in MAU GC/CT pending   Assessment and Plan   1. Trichomonal vaginitis during pregnancy in first trimester   2. Abdominal pain affecting pregnancy, antepartum   3.  Intrauterine pregnancy   4. [redacted] weeks gestation of pregnancy    DC home Comfort measures reviewed  1st Trimester precautions Partner treatment discussed. Patient states partner is currently out of the state.  RX: phenergan PRN #[id number]  Return to MAU as needed   Follow-up Information    Follow up with [facility].   Why:  As scheduled   Contact information:   [address] [place] [address] [phone]         Kanae, Ignatowski 10/28/2015, 8:19 PM

## 2015-10-28 NOTE — MAU Note (Signed)
Came in a few wks ago and found out she was preg. Last night around 5, started having cramping in low back and lower abd. Noted spotting when used restroom, 1 time yesterday.

## 2015-10-29 LAB — GC/CHLAMYDIA PROBE AMP (~~LOC~~) NOT AT ARMC
CHLAMYDIA, DNA PROBE: NEGATIVE
Neisseria Gonorrhea: NEGATIVE

## 2015-11-02 ENCOUNTER — Other Ambulatory Visit: Payer: Self-pay | Admitting: Obstetrics and Gynecology

## 2015-11-02 LAB — OB RESULTS CONSOLE GC/CHLAMYDIA
CHLAMYDIA, DNA PROBE: NEGATIVE
GC PROBE AMP, GENITAL: NEGATIVE

## 2015-11-02 LAB — OB RESULTS CONSOLE RUBELLA ANTIBODY, IGM: RUBELLA: IMMUNE

## 2015-11-02 LAB — OB RESULTS CONSOLE HGB/HCT, BLOOD
HCT: 36 %
Hemoglobin: 11.8 g/dL

## 2015-11-02 LAB — OB RESULTS CONSOLE ANTIBODY SCREEN: Antibody Screen: NEGATIVE

## 2015-11-02 LAB — OB RESULTS CONSOLE PLATELET COUNT: Platelets: 292 10*3/uL

## 2015-11-02 LAB — OB RESULTS CONSOLE RPR: RPR: NONREACTIVE

## 2015-11-02 LAB — OB RESULTS CONSOLE HEPATITIS B SURFACE ANTIGEN: Hepatitis B Surface Ag: NEGATIVE

## 2015-11-02 LAB — OB RESULTS CONSOLE HIV ANTIBODY (ROUTINE TESTING): HIV: NONREACTIVE

## 2015-11-02 LAB — CULTURE, OB URINE: URINE CULTURE, OB: NEGATIVE

## 2015-11-02 LAB — SICKLE CELL SCREEN

## 2015-11-02 LAB — CYTOLOGY - PAP: PAP SMEAR: NEGATIVE

## 2015-11-03 LAB — CYTOLOGY - PAP

## 2016-01-12 ENCOUNTER — Encounter: Payer: Self-pay | Admitting: *Deleted

## 2016-01-12 DIAGNOSIS — Z3490 Encounter for supervision of normal pregnancy, unspecified, unspecified trimester: Secondary | ICD-10-CM | POA: Insufficient documentation

## 2016-01-12 DIAGNOSIS — Z3492 Encounter for supervision of normal pregnancy, unspecified, second trimester: Secondary | ICD-10-CM

## 2016-01-24 ENCOUNTER — Ambulatory Visit (INDEPENDENT_AMBULATORY_CARE_PROVIDER_SITE_OTHER): Payer: 59 | Admitting: Advanced Practice Midwife

## 2016-01-24 ENCOUNTER — Encounter: Payer: Self-pay | Admitting: Advanced Practice Midwife

## 2016-01-24 VITALS — BP 110/68 | HR 90 | Temp 98.1°F | Wt 239.4 lb

## 2016-01-24 DIAGNOSIS — Z9151 Personal history of suicidal behavior: Secondary | ICD-10-CM

## 2016-01-24 DIAGNOSIS — Z3492 Encounter for supervision of normal pregnancy, unspecified, second trimester: Secondary | ICD-10-CM

## 2016-01-24 DIAGNOSIS — Z9149 Other personal history of psychological trauma, not elsewhere classified: Secondary | ICD-10-CM

## 2016-01-24 DIAGNOSIS — Z3A21 21 weeks gestation of pregnancy: Secondary | ICD-10-CM

## 2016-01-24 DIAGNOSIS — IMO0002 Reserved for concepts with insufficient information to code with codable children: Secondary | ICD-10-CM | POA: Insufficient documentation

## 2016-01-24 DIAGNOSIS — O99212 Obesity complicating pregnancy, second trimester: Secondary | ICD-10-CM

## 2016-01-24 DIAGNOSIS — Z1389 Encounter for screening for other disorder: Secondary | ICD-10-CM

## 2016-01-24 DIAGNOSIS — Z3482 Encounter for supervision of other normal pregnancy, second trimester: Secondary | ICD-10-CM

## 2016-01-24 DIAGNOSIS — E669 Obesity, unspecified: Secondary | ICD-10-CM

## 2016-01-24 HISTORY — DX: Personal history of suicidal behavior: Z91.51

## 2016-01-24 HISTORY — DX: Reserved for concepts with insufficient information to code with codable children: IMO0002

## 2016-01-24 NOTE — Progress Notes (Signed)
Here for initial visit- transferring care from [facility]. Given new patient education packet. C/o cough, states felt hot this am but no thermometer.

## 2016-01-24 NOTE — Progress Notes (Signed)
Subjective:  Lorraine Rogers is a 25 y.o. G3P1011 at [redacted]w[redacted]d by 7 week [place] being seen today for NOB transfer from [facility].  She is currently monitored for the following issues for this low-risk pregnancy and supervision of low risk pregnancy.   Patient reports occasional non-productive cough <1 week. .  Contractions: Not present. Vag. Bleeding: None.  Movement: Present. Denies leaking of fluid.   The following portions of the patient's history were reviewed and updated as appropriate: allergies, current medications, past family history, past medical history, past social history, past surgical history and problem list. Problem list updated.  Objective:   Vitals:   01/24/16 0807  BP: 110/68  Pulse: 90  Temp: 98.1 F (36.7 C)  Weight: 239 lb 6.4 oz (108.6 kg)    Fetal Status: Fetal Heart Rate (bpm): 133 Fundal Height: 21 cm Movement: Present     General:  Alert, oriented and cooperative. Patient is in no acute distress.  Skin: Skin is warm and dry. No rash noted.   Cardiovascular: Normal heart rate noted  Respiratory: Normal respiratory effort, no problems with respiration noted  Abdomen: Soft, gravid, appropriate for gestational age. Pain/Pressure: Absent     Pelvic:  Cervical exam deferred        Extremities: Normal range of motion.  Edema: Trace  Mental Status: Normal mood and affect. Normal behavior. Normal judgment and thought content.   Urinalysis: Urine Protein: Negative Urine Glucose: Negative  Assessment and Plan:  Pregnancy: G3P1011 at [redacted]w[redacted]d  1. Encounter for routine screening for malformation using ultrasonics  - [place] MFM OB COMP + 14 WK; Future   2. [redacted] weeks gestation of pregnancy   - [place] MFM OB COMP + 14 WK; Future  3. Supervision of normal pregnancy in second trimester  - Glucose Tolerance, 1 HR (50g) w/o Fasting - [place] MFM OB COMP + 14 WK; Future - Pain Mgmt, Profile 6 Conf w/o mM, U  4. Obesity affecting pregnancy in second trimester, antepartum  -  Glucose Tolerance, 1 HR (50g) w/o Fasting  Preterm labor symptoms and general obstetric precautions including but not limited to vaginal bleeding, contractions, leaking of fluid and fetal movement were reviewed in detail with the patient. Please refer to After Visit Summary for other counseling recommendations.  F/U in 4 weeks   Dorathy Kinsman, [place]

## 2016-01-24 NOTE — Patient Instructions (Signed)

## 2016-01-25 LAB — PAIN MGMT, PROFILE 6 CONF W/O MM, U
6 Acetylmorphine: NEGATIVE ng/mL (ref <10)
Alcohol Metabolites: NEGATIVE ng/mL (ref <500)
Amphetamines: NEGATIVE ng/mL (ref <500)
BARBITURATES: NEGATIVE ng/mL (ref <300)
BENZODIAZEPINES: NEGATIVE ng/mL (ref <100)
COCAINE METABOLITE: NEGATIVE ng/mL (ref <150)
CREATININE: 89.7 mg/dL (ref >=20.0)
MARIJUANA METABOLITE: NEGATIVE ng/mL (ref <20)
METHADONE METABOLITE: NEGATIVE ng/mL (ref <100)
OPIATES: NEGATIVE ng/mL (ref <100)
OXYCODONE: NEGATIVE ng/mL (ref <100)
Oxidant: NEGATIVE ug/mL (ref <200)
PH: 6.92 (ref 4.5–9.0)
PHENCYCLIDINE: NEGATIVE ng/mL (ref <25)
Please note:: 0

## 2016-01-25 LAB — POCT URINALYSIS DIP (DEVICE)
Bilirubin Urine: NEGATIVE
Glucose, UA: NEGATIVE mg/dL
Hgb urine dipstick: NEGATIVE
KETONES UR: NEGATIVE mg/dL
Leukocytes, UA: NEGATIVE
Nitrite: NEGATIVE
PH: 7 (ref 5.0–8.0)
PROTEIN: NEGATIVE mg/dL
Specific Gravity, Urine: 1.02 (ref 1.005–1.030)
UROBILINOGEN UA: 0.2 mg/dL (ref 0.0–1.0)

## 2016-01-25 LAB — GLUCOSE TOLERANCE, 1 HOUR (50G) W/O FASTING: GLUCOSE, 1 HR, GESTATIONAL: 89 mg/dL (ref <140)

## 2016-01-26 ENCOUNTER — Other Ambulatory Visit: Payer: Self-pay | Admitting: Advanced Practice Midwife

## 2016-01-26 ENCOUNTER — Ambulatory Visit (HOSPITAL_COMMUNITY)
Admission: RE | Admit: 2016-01-26 | Discharge: 2016-01-26 | Disposition: A | Discharge status: Home / Self Care | Payer: 59 | Source: Ambulatory Visit | Attending: Advanced Practice Midwife | Admitting: Advanced Practice Midwife

## 2016-01-26 DIAGNOSIS — Z1389 Encounter for screening for other disorder: Secondary | ICD-10-CM

## 2016-01-26 DIAGNOSIS — Z3492 Encounter for supervision of normal pregnancy, unspecified, second trimester: Secondary | ICD-10-CM

## 2016-01-26 DIAGNOSIS — O99212 Obesity complicating pregnancy, second trimester: Secondary | ICD-10-CM

## 2016-01-26 DIAGNOSIS — Z3A20 20 weeks gestation of pregnancy: Secondary | ICD-10-CM

## 2016-01-26 DIAGNOSIS — Z3A21 21 weeks gestation of pregnancy: Secondary | ICD-10-CM

## 2016-01-26 DIAGNOSIS — Z36 Encounter for antenatal screening of mother: Secondary | ICD-10-CM | POA: Diagnosis not present

## 2016-02-21 ENCOUNTER — Ambulatory Visit (INDEPENDENT_AMBULATORY_CARE_PROVIDER_SITE_OTHER): Payer: 59 | Admitting: Obstetrics and Gynecology

## 2016-02-21 VITALS — BP 104/64 | HR 63 | Wt 250.9 lb

## 2016-02-21 DIAGNOSIS — Z3492 Encounter for supervision of normal pregnancy, unspecified, second trimester: Secondary | ICD-10-CM

## 2016-02-21 LAB — POCT URINALYSIS DIP (DEVICE)
Bilirubin Urine: NEGATIVE
Glucose, UA: NEGATIVE mg/dL
Ketones, ur: NEGATIVE mg/dL
NITRITE: NEGATIVE
PROTEIN: NEGATIVE mg/dL
SPECIFIC GRAVITY, URINE: 1.025 (ref 1.005–1.030)
UROBILINOGEN UA: 0.2 mg/dL (ref 0.0–1.0)
pH: 5.5 (ref 5.0–8.0)
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
## 2016-02-21 NOTE — Patient Instructions (Signed)

## 2016-02-22 ENCOUNTER — Encounter: Payer: Self-pay | Admitting: Obstetrics & Gynecology

## 2016-03-13 ENCOUNTER — Telehealth: Payer: Self-pay | Admitting: *Deleted

## 2016-03-13 ENCOUNTER — Telehealth: Payer: Self-pay | Admitting: Family Medicine

## 2016-03-13 NOTE — Progress Notes (Signed)
Patient was transferred to me by the registration staff.  Patient very upset that we had moved her appointment.  States she has taken off work.  Explained to patient we would honor her original appointment date and time.  Patient states she works 7 am - 7 pm.  I asked how she would be able to attend appointments going forward.  Patient states as soon as she makes the appointment she notifies her boss and they arrange for her to be off.  I offered to complete Intermittent FMLA papers for patient to allow for time for her appointments.  Patient states she just started this job and isn't eligible until she has been there 6 months.  Explained to patient I was sorry for inconvenience.  Patient states understanding.

## 2016-03-13 NOTE — Telephone Encounter (Signed)
Called patient to inform her that her appointment had to be moved due to the Provider not being available the day she was to come in. Patient stated she could not do the new scheduled date I had given her because she works from early in the morning, until late in the afternoon everyday. When I asked her when did she think she could come in, she stated she didn't know. She was upset because she said I could put her with another provider. When I told her the other providers had a full scheduled, she stated it wasn't her fault. Then stated she would find another provider and hung up.

## 2016-03-20 ENCOUNTER — Encounter: Payer: 59 | Admitting: Obstetrics & Gynecology

## 2016-03-20 ENCOUNTER — Encounter: Payer: Self-pay | Admitting: Obstetrics and Gynecology

## 2016-03-20 ENCOUNTER — Ambulatory Visit (INDEPENDENT_AMBULATORY_CARE_PROVIDER_SITE_OTHER): Payer: 59 | Admitting: Obstetrics and Gynecology

## 2016-03-20 VITALS — BP 112/54 | HR 84 | Wt 253.8 lb

## 2016-03-20 DIAGNOSIS — E669 Obesity, unspecified: Secondary | ICD-10-CM | POA: Insufficient documentation

## 2016-03-20 DIAGNOSIS — Z3492 Encounter for supervision of normal pregnancy, unspecified, second trimester: Secondary | ICD-10-CM

## 2016-03-20 DIAGNOSIS — Z23 Encounter for immunization: Secondary | ICD-10-CM

## 2016-03-20 DIAGNOSIS — O99210 Obesity complicating pregnancy, unspecified trimester: Secondary | ICD-10-CM | POA: Insufficient documentation

## 2016-03-20 DIAGNOSIS — Z3493 Encounter for supervision of normal pregnancy, unspecified, third trimester: Secondary | ICD-10-CM

## 2016-03-20 HISTORY — DX: Obesity, unspecified: E66.9

## 2016-03-20 LAB — CBC
HCT: 30.8 % — ABNORMAL LOW (ref 35.0–45.0)
HEMOGLOBIN: 10.5 g/dL — AB (ref 11.7–15.5)
MCH: 30.3 pg (ref 27.0–33.0)
MCHC: 34.1 g/dL (ref 32.0–36.0)
MCV: 88.8 fL (ref 80.0–100.0)
MPV: 11.3 fL (ref 7.5–12.5)
Platelets: 271 10*3/uL (ref 140–400)
RBC: 3.47 MIL/uL — ABNORMAL LOW (ref 3.80–5.10)
RDW: 12.9 % (ref 11.0–15.0)
WBC: 13.4 10*3/uL — ABNORMAL HIGH (ref 3.8–10.8)

## 2016-03-20 LAB — POCT URINALYSIS DIP (DEVICE)
Bilirubin Urine: NEGATIVE
GLUCOSE, UA: NEGATIVE mg/dL
HGB URINE DIPSTICK: NEGATIVE
Ketones, ur: NEGATIVE mg/dL
NITRITE: NEGATIVE
PROTEIN: NEGATIVE mg/dL
Specific Gravity, Urine: 1.02 (ref 1.005–1.030)
UROBILINOGEN UA: 1 mg/dL (ref 0.0–1.0)
pH: 5.5 (ref 5.0–8.0)

## 2016-03-20 MED ORDER — TETANUS-DIPHTH-ACELL PERTUSSIS 5-2.5-18.5 LF-MCG/0.5 IM SUSP
0.5000 mL | Freq: Once | INTRAMUSCULAR | Status: AC
  Administered 2016-03-20: 0.5 mL via INTRAMUSCULAR

## 2016-03-20 NOTE — Progress Notes (Signed)
28 week labs/1 hr gtt today Flu/Tdap today
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
## 2016-03-21 LAB — HIV ANTIBODY (ROUTINE TESTING W REFLEX): HIV: NONREACTIVE

## 2016-03-21 LAB — GLUCOSE TOLERANCE, 1 HOUR (50G) W/O FASTING: Glucose, 1 Hr, gestational: 100 mg/dL (ref <140)

## 2016-03-21 LAB — RPR

## 2016-03-22 ENCOUNTER — Telehealth: Payer: Self-pay | Admitting: Obstetrics and Gynecology

## 2016-03-22 ENCOUNTER — Encounter (HOSPITAL_COMMUNITY): Payer: Self-pay | Admitting: *Deleted

## 2016-03-22 ENCOUNTER — Inpatient Hospital Stay (HOSPITAL_COMMUNITY)
Admission: AD | Admit: 2016-03-22 | Discharge: 2016-03-22 | Disposition: A | Discharge status: Home / Self Care | Payer: 59 | Source: Ambulatory Visit | Attending: Obstetrics & Gynecology | Admitting: Obstetrics & Gynecology

## 2016-03-22 DIAGNOSIS — O9A213 Injury, poisoning and certain other consequences of external causes complicating pregnancy, third trimester: Secondary | ICD-10-CM | POA: Diagnosis not present

## 2016-03-22 DIAGNOSIS — O26893 Other specified pregnancy related conditions, third trimester: Secondary | ICD-10-CM | POA: Insufficient documentation

## 2016-03-22 DIAGNOSIS — S3992XA Unspecified injury of lower back, initial encounter: Secondary | ICD-10-CM

## 2016-03-22 DIAGNOSIS — W03XXXA Other fall on same level due to collision with another person, initial encounter: Secondary | ICD-10-CM

## 2016-03-22 DIAGNOSIS — Y9289 Other specified places as the place of occurrence of the external cause: Secondary | ICD-10-CM | POA: Diagnosis not present

## 2016-03-22 DIAGNOSIS — Z3A28 28 weeks gestation of pregnancy: Secondary | ICD-10-CM | POA: Diagnosis not present

## 2016-03-22 LAB — URINE MICROSCOPIC-ADD ON

## 2016-03-22 LAB — URINALYSIS, ROUTINE W REFLEX MICROSCOPIC
Bilirubin Urine: NEGATIVE
GLUCOSE, UA: NEGATIVE mg/dL
HGB URINE DIPSTICK: NEGATIVE
KETONES UR: 15 mg/dL — AB
Nitrite: NEGATIVE
PH: 6 (ref 5.0–8.0)
Protein, ur: 30 mg/dL — AB
Specific Gravity, Urine: >1.03 — ABNORMAL HIGH (ref 1.005–1.030)

## 2016-03-22 MED ORDER — CYCLOBENZAPRINE HCL 10 MG PO TABS: 10.0000 mg | ORAL_TABLET | Freq: Three times a day (TID) | ORAL | 2 refills | Status: DC | PRN
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
## 2016-03-22 NOTE — Discharge Instructions (Signed)

## 2016-04-03 ENCOUNTER — Encounter: Payer: 59 | Admitting: Obstetrics and Gynecology

## 2016-04-04 ENCOUNTER — Ambulatory Visit (INDEPENDENT_AMBULATORY_CARE_PROVIDER_SITE_OTHER): Payer: 59 | Admitting: Obstetrics and Gynecology

## 2016-04-04 DIAGNOSIS — Z3493 Encounter for supervision of normal pregnancy, unspecified, third trimester: Secondary | ICD-10-CM

## 2016-04-04 NOTE — Patient Instructions (Signed)
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
## 2016-04-18 ENCOUNTER — Ambulatory Visit (INDEPENDENT_AMBULATORY_CARE_PROVIDER_SITE_OTHER): Payer: 59 | Admitting: Advanced Practice Midwife

## 2016-04-18 VITALS — BP 103/76 | HR 84 | Wt 263.3 lb

## 2016-04-18 DIAGNOSIS — Z3493 Encounter for supervision of normal pregnancy, unspecified, third trimester: Secondary | ICD-10-CM

## 2016-04-18 DIAGNOSIS — O26843 Uterine size-date discrepancy, third trimester: Secondary | ICD-10-CM
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
## 2016-04-18 NOTE — Patient Instructions (Signed)

## 2016-04-24 ENCOUNTER — Ambulatory Visit (HOSPITAL_COMMUNITY)
Admission: RE | Admit: 2016-04-24 | Discharge: 2016-04-24 | Disposition: A | Discharge status: Home / Self Care | Payer: 59 | Source: Ambulatory Visit | Attending: Advanced Practice Midwife | Admitting: Advanced Practice Midwife

## 2016-04-24 ENCOUNTER — Other Ambulatory Visit: Payer: Self-pay | Admitting: Advanced Practice Midwife

## 2016-04-24 DIAGNOSIS — Z362 Encounter for other antenatal screening follow-up: Secondary | ICD-10-CM | POA: Insufficient documentation

## 2016-04-24 DIAGNOSIS — O26843 Uterine size-date discrepancy, third trimester: Secondary | ICD-10-CM

## 2016-04-24 DIAGNOSIS — Z3A33 33 weeks gestation of pregnancy: Secondary | ICD-10-CM | POA: Diagnosis not present

## 2016-04-24 DIAGNOSIS — Z3689 Encounter for other specified antenatal screening: Secondary | ICD-10-CM

## 2016-04-24 DIAGNOSIS — O99213 Obesity complicating pregnancy, third trimester: Secondary | ICD-10-CM

## 2016-04-26 ENCOUNTER — Inpatient Hospital Stay (HOSPITAL_COMMUNITY)
Admission: AD | Admit: 2016-04-26 | Discharge: 2016-04-26 | Disposition: A | Discharge status: Home / Self Care | Payer: 59 | Source: Ambulatory Visit | Attending: Family Medicine | Admitting: Family Medicine

## 2016-04-26 ENCOUNTER — Encounter (HOSPITAL_COMMUNITY): Payer: Self-pay | Admitting: *Deleted

## 2016-04-26 DIAGNOSIS — Z3A34 34 weeks gestation of pregnancy: Secondary | ICD-10-CM | POA: Diagnosis not present

## 2016-04-26 DIAGNOSIS — Z880 Allergy status to penicillin: Secondary | ICD-10-CM | POA: Diagnosis not present

## 2016-04-26 DIAGNOSIS — Z3A33 33 weeks gestation of pregnancy: Secondary | ICD-10-CM | POA: Diagnosis not present

## 2016-04-26 DIAGNOSIS — O26893 Other specified pregnancy related conditions, third trimester: Secondary | ICD-10-CM | POA: Diagnosis not present

## 2016-04-26 DIAGNOSIS — Z3493 Encounter for supervision of normal pregnancy, unspecified, third trimester: Secondary | ICD-10-CM

## 2016-04-26 DIAGNOSIS — Z87891 Personal history of nicotine dependence: Secondary | ICD-10-CM | POA: Diagnosis not present

## 2016-04-26 DIAGNOSIS — R1030 Lower abdominal pain, unspecified: Secondary | ICD-10-CM | POA: Diagnosis not present

## 2016-04-26 DIAGNOSIS — O4700 False labor before 37 completed weeks of gestation, unspecified trimester: Secondary | ICD-10-CM

## 2016-04-26 DIAGNOSIS — O479 False labor, unspecified: Secondary | ICD-10-CM

## 2016-04-26 DIAGNOSIS — O4703 False labor before 37 completed weeks of gestation, third trimester: Secondary | ICD-10-CM

## 2016-04-26 LAB — URINALYSIS, ROUTINE W REFLEX MICROSCOPIC
BILIRUBIN URINE: NEGATIVE
Glucose, UA: NEGATIVE mg/dL
Ketones, ur: NEGATIVE mg/dL
NITRITE: NEGATIVE
PH: 7.5 (ref 5.0–8.0)
Protein, ur: NEGATIVE mg/dL
SPECIFIC GRAVITY, URINE: 1.02 (ref 1.005–1.030)

## 2016-04-26 LAB — COMPREHENSIVE METABOLIC PANEL
ALBUMIN: 3 g/dL — AB (ref 3.5–5.0)
ALK PHOS: 69 U/L (ref 38–126)
ALT: 23 U/L (ref 14–54)
ANION GAP: 8 (ref 5–15)
AST: 49 U/L — ABNORMAL HIGH (ref 15–41)
BUN: 5 mg/dL — ABNORMAL LOW (ref 6–20)
CALCIUM: 9.1 mg/dL (ref 8.9–10.3)
CO2: 20 mmol/L — AB (ref 22–32)
Chloride: 107 mmol/L (ref 101–111)
Creatinine, Ser: 0.5 mg/dL (ref 0.44–1.00)
GFR calc non Af Amer: >60 mL/min (ref >60)
GLUCOSE: 102 mg/dL — AB (ref 65–99)
POTASSIUM: 4 mmol/L (ref 3.5–5.1)
SODIUM: 135 mmol/L (ref 135–145)
Total Bilirubin: 0.6 mg/dL (ref 0.3–1.2)
Total Protein: 6.3 g/dL — ABNORMAL LOW (ref 6.5–8.1)

## 2016-04-26 LAB — URINE MICROSCOPIC-ADD ON

## 2016-04-26 LAB — CBC
HEMATOCRIT: 31.5 % — AB (ref 36.0–46.0)
HEMOGLOBIN: 10.6 g/dL — AB (ref 12.0–15.0)
MCH: 29.6 pg (ref 26.0–34.0)
MCHC: 33.7 g/dL (ref 30.0–36.0)
MCV: 88 fL (ref 78.0–100.0)
Platelets: 270 10*3/uL (ref 150–400)
RBC: 3.58 MIL/uL — AB (ref 3.87–5.11)
RDW: 13.5 % (ref 11.5–15.5)
WBC: 14.2 10*3/uL — AB (ref 4.0–10.5)

## 2016-04-26 LAB — FETAL FIBRONECTIN: Fetal Fibronectin: NEGATIVE

## 2016-04-26 MED ORDER — FAMOTIDINE IN NACL 20-0.9 MG/50ML-% IV SOLN
20.0000 mg | Freq: Once | INTRAVENOUS | Status: AC
Start: 2016-04-26 — End: 2016-04-26
  Administered 2016-04-26: 20 mg via INTRAVENOUS
  Filled 2016-04-26: qty 50

## 2016-04-26 MED ORDER — ONDANSETRON HCL 4 MG/2ML IJ SOLN
4.0000 mg | Freq: Once | INTRAMUSCULAR | Status: AC
  Administered 2016-04-26: 4 mg via INTRAVENOUS
  Filled 2016-04-26: qty 2

## 2016-04-26 MED ORDER — LACTATED RINGERS IV BOLUS (SEPSIS)
1000.0000 mL | Freq: Once | INTRAVENOUS | Status: AC
  Administered 2016-04-26: 1000 mL via INTRAVENOUS

## 2016-04-26 MED ORDER — OXYCODONE-ACETAMINOPHEN 5-325 MG PO TABS
1.0000 | ORAL_TABLET | Freq: Once | ORAL | Status: AC
  Administered 2016-04-26: 1 via ORAL
  Filled 2016-04-26: qty 1

## 2016-04-26 NOTE — MAU Note (Signed)
Arrived via EMS with C/O contractions that started around 0715. Denies complications with pregnancy. Goes to clinic here. Denies leaking or bleeding. Has had previous full term uncomplicated delivery.

## 2016-04-26 NOTE — MAU Note (Signed)
Patient vomited a large amount of partially digested food on the bed and floor. Blanche EastJ. Rasch, NP notified and was in to see patient.

## 2016-04-26 NOTE — MAU Note (Signed)
Patient dozing lightly, states pain has subsided. Asking if she can go to work [employer]. Asking for ice chips.

## 2016-04-26 NOTE — Discharge Instructions (Signed)

## 2016-04-26 NOTE — MAU Provider Note (Signed)
Lorraine Rogers is  25 y.o. G3P1011 at [redacted]w[redacted]d presents complaining of Contractions.  She states irregular contractions that began suddenly this morning at 0715. Denies leaking of fluid or vaginal bleeding, feels positive fetal movements. Patient states the pain began on her right side and then radiated through her lower abdomen and over to the left side. The pain is coming and going but is also present between contractions.    History   This pregnancy is significant for EFW greater than the 89% and S greater than D. At last clinic visit  it was recommended that she repeat glucose intolerance test and ultrasound in 4 weeks.   CSN: [id number]  Arrival date and time: 04/26/16 1191   First Provider Initiated Contact with Patient 04/26/16 [phone]      Chief Complaint  Patient presents with  . Contractions   HPI    Past Medical History:  Diagnosis Date  . No pertinent past medical history   . Tonsillitis, chronic   . Trichomoniasis of vagina     Past Surgical History:  Procedure Laterality Date  . TONSILLECTOMY N/A 01/19/2014   Procedure: TONSILLECTOMY;  Surgeon: Christia Reading, MD;  Location: [facility];  Service: ENT;  Laterality: N/A;    Family History  Problem Relation Age of Onset  . Diabetes Mother     Social History  Substance Use Topics  . Smoking status: Former Smoker    Packs/day: 0.25    Types: Cigarettes    Quit date: 10/21/2015  . Smokeless tobacco: Never Used  . Alcohol use 0.6 oz/week    1 Shots of liquor per week     Comment: social-stopped 10/2015    Allergies:  Allergies  Allergen Reactions  . Penicillins Hives and Rash    Has patient had a PCN reaction causing immediate rash, facial/tongue/throat swelling, SOB or lightheadedness with hypotension: Yes Has patient had a PCN reaction causing severe rash involving mucus membranes or skin necrosis: No Has patient had a PCN reaction that required hospitalization Yes Has patient had a PCN reaction  occurring within the last 10 years: Yes If all of the above answers are "NO", then may proceed with Cephalosporin use.  . Sulfa Antibiotics Hives and Rash    Prescriptions Prior to Admission  Medication Sig Dispense Refill Last Dose  . Prenatal Multivit-Min-Fe-FA (PRENATAL VITAMINS PO) Take 1 tablet by mouth daily.   04/25/2016 at Unknown time  . cyclobenzaprine (FLEXERIL) 10 MG tablet Take 1 tablet (10 mg total) by mouth 3 (three) times daily as needed for muscle spasms. (Patient not taking: Reported on 04/26/2016) 30 tablet 2 Not Taking at Unknown time    Review of Systems  Constitutional: Negative.   HENT: Negative.   Eyes: Negative.   Respiratory: Negative.   Cardiovascular: Negative.   Gastrointestinal: Positive for abdominal pain.  Genitourinary: Negative.   Musculoskeletal: Negative.   Skin: Negative.   Neurological: Negative.   Endo/Heme/Allergies: Negative.   Psychiatric/Behavioral: Negative.    Physical Exam   Blood pressure 107/64, pulse 83, temperature 97.5 F (36.4 C), temperature source Oral, resp. rate 20, last menstrual period 08/22/2015, unknown if currently breastfeeding.  Physical Exam  Constitutional: She is oriented to person, place, and time. She appears well-developed.  HENT:  Head: Normocephalic.  Eyes: Conjunctivae and EOM are normal.  Neck: Normal range of motion.  Respiratory: Effort normal.  GI: There is tenderness.  Genitourinary: Vagina normal and uterus normal.  Musculoskeletal: Normal range of motion.  Neurological: She is  alert and oriented to person, place, and time.  Skin: Skin is warm and dry.  Patient's cervix is closed, thin and anterior-facing. Negative CMT, normal appearing labia.   MAU Course  Procedures None MDM 1. IV hydration with pepcid and zofran 2. Percocet for pain 3. CBC for white count to rule out appendicitis. 4. FFN to rule out pre-term labor 5. FHR is category 1 6. Will reassess after IV bolus and percocet and  CBC and FFNM  Patient vomited 30 minutes after percocet. Patient has completed her IV fluid bolus and now states her pain is a 0/10. Contractions have now subsided.  Assessment and Plan  Patient's pain is now a 0/10. FHR is Cat 1 with no contractions, accelerations are present, there is  moderate variability and a baseline of 125. Given that pain was relieved with percocet, contractions have subsided, and FFN was negative, pre-term labor is unlikely. Index of suspicious for appendicitis is low given that CBC shows no grossly elevated white count, patient is afebrile, and abdominal pain relieved with percocet.  After consulting with Dr. Alvester MorinNewton, patient to be discharged home with pre-term labor precautions and follow up for her regular clinic appointment on    Lorraine GaribaldiKathryn Lorraine Uno Rogers 04/26/2016, 9:08 AM

## 2016-05-02 ENCOUNTER — Ambulatory Visit (INDEPENDENT_AMBULATORY_CARE_PROVIDER_SITE_OTHER): Payer: 59 | Admitting: Obstetrics and Gynecology

## 2016-05-02 VITALS — BP 110/60 | HR 72 | Wt 265.0 lb

## 2016-05-02 DIAGNOSIS — O3663X0 Maternal care for excessive fetal growth, third trimester, not applicable or unspecified: Secondary | ICD-10-CM

## 2016-05-02 DIAGNOSIS — O3663X1 Maternal care for excessive fetal growth, third trimester, fetus 1: Secondary | ICD-10-CM

## 2016-05-02 DIAGNOSIS — Z3493 Encounter for supervision of normal pregnancy, unspecified, third trimester: Secondary | ICD-10-CM
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
## 2016-05-17 ENCOUNTER — Ambulatory Visit (INDEPENDENT_AMBULATORY_CARE_PROVIDER_SITE_OTHER): Payer: 59 | Admitting: Student

## 2016-05-17 VITALS — BP 115/74 | HR 89 | Wt 250.0 lb

## 2016-05-17 DIAGNOSIS — Z113 Encounter for screening for infections with a predominantly sexual mode of transmission: Secondary | ICD-10-CM

## 2016-05-17 DIAGNOSIS — O26843 Uterine size-date discrepancy, third trimester: Secondary | ICD-10-CM

## 2016-05-17 DIAGNOSIS — Z3493 Encounter for supervision of normal pregnancy, unspecified, third trimester: Secondary | ICD-10-CM

## 2016-05-17 NOTE — Patient Instructions (Signed)
Third Trimester of Pregnancy The third trimester is from week 29 through week 40 (months 7 through 9). The third trimester is a time when the unborn baby (fetus) is growing rapidly. At the end of the ninth month, the fetus is about 20 inches in length and weighs 6-10 pounds. Body changes during your third trimester Your body goes through many changes during pregnancy. The changes vary from woman to woman. During the third trimester:  Your weight will continue to increase. You can expect to gain 25-35 pounds (11-16 kg) by the end of the pregnancy.  You may begin to get stretch marks on your hips, abdomen, and breasts.  You may urinate more often because the fetus is moving lower into your pelvis and pressing on your bladder.  You may develop or continue to have heartburn. This is caused by increased hormones that slow down muscles in the digestive tract.  You may develop or continue to have constipation because increased hormones slow digestion and cause the muscles that push waste through your intestines to relax.  You may develop hemorrhoids. These are swollen veins (varicose veins) in the rectum that can itch or be painful.  You may develop swollen, bulging veins (varicose veins) in your legs.  You may have increased body aches in the pelvis, back, or thighs. This is due to weight gain and increased hormones that are relaxing your joints.  You may have changes in your hair. These can include thickening of your hair, rapid growth, and changes in texture. Some women also have hair loss during or after pregnancy, or hair that feels dry or thin. Your hair will most likely return to normal after your baby is born.  Your breasts will continue to grow and they will continue to become tender. A yellow fluid (colostrum) may leak from your breasts. This is the first milk you are producing for your baby.  Your belly button may stick out.  You may notice more swelling in your hands, face, or  ankles.  You may have increased tingling or numbness in your hands, arms, and legs. The skin on your belly may also feel numb.  You may feel short of breath because of your expanding uterus.  You may have more problems sleeping. This can be caused by the size of your belly, increased need to urinate, and an increase in your body's metabolism.  You may notice the fetus "dropping," or moving lower in your abdomen.  You may have increased vaginal discharge.  Your cervix becomes thin and soft (effaced) near your due date. What to expect at prenatal visits You will have prenatal exams every 2 weeks until week 36. Then you will have weekly prenatal exams. During a routine prenatal visit:  You will be weighed to make sure you and the fetus are growing normally.  Your blood pressure will be taken.  Your abdomen will be measured to track your baby's growth.  The fetal heartbeat will be listened to.  Any test results from the previous visit will be discussed.  You may have a cervical check near your due date to see if you have effaced. At around 36 weeks, your health care provider will check your cervix. At the same time, your health care provider will also perform a test on the secretions of the vaginal tissue. This test is to determine if a type of bacteria, Group B streptococcus, is present. Your health care provider will explain this further. Your health care provider may ask you:    What your birth plan is.  How you are feeling.  If you are feeling the baby move.  If you have had any abnormal symptoms, such as leaking fluid, bleeding, severe headaches, or abdominal cramping.  If you are using any tobacco products, including cigarettes, chewing tobacco, and electronic cigarettes.  If you have any questions. Other tests or screenings that may be performed during your third trimester include:  Blood tests that check for low iron levels (anemia).  Fetal testing to check the health,  activity level, and growth of the fetus. Testing is done if you have certain medical conditions or if there are problems during the pregnancy.  Nonstress test (NST). This test checks the health of your baby to make sure there are no signs of problems, such as the baby not getting enough oxygen. During this test, a belt is placed around your belly. The baby is made to move, and its heart rate is monitored during movement. What is false labor? False labor is a condition in which you feel small, irregular tightenings of the muscles in the womb (contractions) that eventually go away. These are called Braxton Hicks contractions. Contractions may last for hours, days, or even weeks before true labor sets in. If contractions come at regular intervals, become more frequent, increase in intensity, or become painful, you should see your health care provider. What are the signs of labor?  Abdominal cramps.  Regular contractions that start at 10 minutes apart and become stronger and more frequent with time.  Contractions that start on the top of the uterus and spread down to the lower abdomen and back.  Increased pelvic pressure and dull back pain.  A watery or bloody mucus discharge that comes from the vagina.  Leaking of amniotic fluid. This is also known as your "water breaking." It could be a slow trickle or a gush. Let your doctor know if it has a color or strange odor. If you have any of these signs, call your health care provider right away, even if it is before your due date. Follow these instructions at home: Eating and drinking  Continue to eat regular, healthy meals.  Do not eat:  Raw meat or meat spreads.  Unpasteurized milk or cheese.  Unpasteurized juice.  Store-made salad.  Refrigerated smoked seafood.  Hot dogs or deli meat, unless they are piping hot.  More than 6 ounces of albacore tuna a week.  Shark, swordfish, king mackerel, or tile fish.  Store-made salads.  Raw  sprouts, such as mung bean or alfalfa sprouts.  Take prenatal vitamins as told by your health care provider.  Take 1000 mg of calcium daily as told by your health care provider.  If you develop constipation:  Take over-the-counter or prescription medicines.  Drink enough fluid to keep your urine clear or pale yellow.  Eat foods that are high in fiber, such as fresh fruits and vegetables, whole grains, and beans.  Limit foods that are high in fat and processed sugars, such as fried and sweet foods. Activity  Exercise only as directed by your health care provider. Healthy pregnant women should aim for 2 hours and 30 minutes of moderate exercise per week. If you experience any pain or discomfort while exercising, stop.  Avoid heavy lifting.  Do not exercise in extreme heat or humidity, or at high altitudes.  Wear low-heel, comfortable shoes.  Practice good posture.  Do not travel far distances unless it is absolutely necessary and only with the approval   of your health care provider.  Wear your seat belt at all times while in a car, on a bus, or on a plane.  Take frequent breaks and rest with your legs elevated if you have leg cramps or low back pain.  Do not use hot tubs, steam rooms, or saunas.  You may continue to have sex unless your health care provider tells you otherwise. Lifestyle  Do not use any products that contain nicotine or tobacco, such as cigarettes and e-cigarettes. If you need help quitting, ask your health care provider.  Do not drink alcohol.  Do not use any medicinal herbs or unprescribed drugs. These chemicals affect the formation and growth of the baby.  If you develop varicose veins:  Wear support pantyhose or compression stockings as told by your healthcare provider.  Elevate your feet for 15 minutes, 3-4 times a day.  Wear a supportive maternity bra to help with breast tenderness. General instructions  Take over-the-counter and prescription  medicines only as told by your health care provider. There are medicines that are either safe or unsafe to take during pregnancy.  Take warm sitz baths to soothe any pain or discomfort caused by hemorrhoids. Use hemorrhoid cream or witch hazel if your health care provider approves.  Avoid cat litter boxes and soil used by cats. These carry germs that can cause birth defects in the baby. If you have a cat, ask someone to clean the litter box for you.  To prepare for the arrival of your baby:  Take prenatal classes to understand, practice, and ask questions about the labor and delivery.  Make a trial run to the hospital.  Visit the hospital and tour the maternity area.  Arrange for maternity or paternity leave through employers.  Arrange for family and friends to take care of pets while you are in the hospital.  Purchase a rear-facing car seat and make sure you know how to install it in your car.  Pack your hospital bag.  Prepare the baby's nursery. Make sure to remove all pillows and stuffed animals from the baby's crib to prevent suffocation.  Visit your dentist if you have not gone during your pregnancy. Use a soft toothbrush to brush your teeth and be gentle when you floss.  Keep all prenatal follow-up visits as told by your health care provider. This is important. Contact a health care provider if:  You are unsure if you are in labor or if your water has broken.  You become dizzy.  You have mild pelvic cramps, pelvic pressure, or nagging pain in your abdominal area.  You have lower back pain.  You have persistent nausea, vomiting, or diarrhea.  You have an unusual or bad smelling vaginal discharge.  You have pain when you urinate. Get help right away if:  You have a fever.  You are leaking fluid from your vagina.  You have spotting or bleeding from your vagina.  You have severe abdominal pain or cramping.  You have rapid weight loss or weight gain.  You have  shortness of breath with chest pain.  You notice sudden or extreme swelling of your face, hands, ankles, feet, or legs.  Your baby makes fewer than 10 movements in 2 hours.  You have severe headaches that do not go away with medicine.  You have vision changes. Summary  The third trimester is from week 29 through week 40, months 7 through 9. The third trimester is a time when the unborn baby (fetus)   is growing rapidly.  During the third trimester, your discomfort may increase as you and your baby continue to gain weight. You may have abdominal, leg, and back pain, sleeping problems, and an increased need to urinate.  During the third trimester your breasts will keep growing and they will continue to become tender. A yellow fluid (colostrum) may leak from your breasts. This is the first milk you are producing for your baby.  False labor is a condition in which you feel small, irregular tightenings of the muscles in the womb (contractions) that eventually go away. These are called Braxton Hicks contractions. Contractions may last for hours, days, or even weeks before true labor sets in.  Signs of labor can include: abdominal cramps; regular contractions that start at 10 minutes apart and become stronger and more frequent with time; watery or bloody mucus discharge that comes from the vagina; increased pelvic pressure and dull back pain; and leaking of amniotic fluid. This information is not intended to replace advice given to you by your health care provider. Make sure you discuss any questions you have with your health care provider. Document Released: 06/13/2001 Document Revised: 11/25/2015 Document Reviewed: 08/20/2012 Elsevier Interactive Patient Education  2017 [employer]  
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
## 2016-05-17 NOTE — Progress Notes (Signed)
Patient has had 15lb weight loss since last visit

## 2016-05-18 LAB — GC/CHLAMYDIA PROBE AMP (~~LOC~~) NOT AT ARMC
Chlamydia: NEGATIVE
Neisseria Gonorrhea: POSITIVE — AB

## 2016-05-19 ENCOUNTER — Ambulatory Visit (INDEPENDENT_AMBULATORY_CARE_PROVIDER_SITE_OTHER): Payer: 59 | Admitting: *Deleted

## 2016-05-19 ENCOUNTER — Telehealth: Payer: Self-pay | Admitting: *Deleted

## 2016-05-19 DIAGNOSIS — O98213 Gonorrhea complicating pregnancy, third trimester: Secondary | ICD-10-CM | POA: Diagnosis not present

## 2016-05-19 LAB — CULTURE, STREPTOCOCCUS GRP B W/SUSCEPT

## 2016-05-19 MED ORDER — AZITHROMYCIN 250 MG PO TABS
1000.0000 mg | ORAL_TABLET | Freq: Every day | ORAL | Status: AC
  Administered 2016-05-19: 1000 mg via ORAL

## 2016-05-19 MED ORDER — CEFTRIAXONE SODIUM 250 MG IJ SOLR
250.0000 mg | Freq: Once | INTRAMUSCULAR | Status: AC
  Administered 2016-05-19: 250 mg via INTRAMUSCULAR

## 2016-05-19 NOTE — Telephone Encounter (Signed)
Patient notified for positive gonorrhea result. Advised to come in asap to get treated. Also advised to tell her partner so he can be treated as well. Pt voiced understanding. She is not sure she and make it in today but said she would try, otherwise she will be here on Monday. [facility]

## 2016-05-19 NOTE — Progress Notes (Signed)
Patient presented to clinic for STD treatment. Given 1 gm azithromycin po and 250mg  rocephin IM. Pt tolerated well. Advised to avoid intercourse for 7-10 days after both she and her partner have been treated. Understanding voiced. Patient stated she has notified her partner and he is seeking treatment at the health dept.

## 2016-05-24 ENCOUNTER — Encounter (HOSPITAL_COMMUNITY): Payer: Self-pay

## 2016-05-24 ENCOUNTER — Inpatient Hospital Stay (EMERGENCY_DEPARTMENT_HOSPITAL)
Admission: AD | Admit: 2016-05-24 | Discharge: 2016-05-24 | Disposition: A | Discharge status: Home / Self Care | Payer: 59 | Source: Ambulatory Visit | Attending: Family Medicine | Admitting: Family Medicine

## 2016-05-24 DIAGNOSIS — N898 Other specified noninflammatory disorders of vagina: Secondary | ICD-10-CM

## 2016-05-24 DIAGNOSIS — O26893 Other specified pregnancy related conditions, third trimester: Secondary | ICD-10-CM | POA: Diagnosis not present

## 2016-05-24 LAB — WET PREP, GENITAL
CLUE CELLS WET PREP: NONE SEEN
SPERM: NONE SEEN
Trich, Wet Prep: NONE SEEN
Yeast Wet Prep HPF POC: NONE SEEN

## 2016-05-24 LAB — POCT FERN TEST: POCT FERN TEST: NEGATIVE

## 2016-05-24 NOTE — Discharge Instructions (Signed)
Braxton Hicks Contractions °Contractions of the uterus can occur throughout pregnancy. Contractions are not always a sign that you are in labor.  °WHAT ARE BRAXTON HICKS CONTRACTIONS?  °Contractions that occur before labor are called Braxton Hicks contractions, or false labor. Toward the end of pregnancy (32-34 weeks), these contractions can develop more often and may become more forceful. This is not true labor because these contractions do not result in opening (dilatation) and thinning of the cervix. They are sometimes difficult to tell apart from true labor because these contractions can be forceful and people have different pain tolerances. You should not feel embarrassed if you go to the hospital with false labor. Sometimes, the only way to tell if you are in true labor is for your health care provider to look for changes in the cervix. °If there are no prenatal problems or other health problems associated with the pregnancy, it is completely safe to be sent home with false labor and await the onset of true labor. °HOW CAN YOU TELL THE DIFFERENCE BETWEEN TRUE AND FALSE LABOR? °False Labor  °· The contractions of false labor are usually shorter and not as hard as those of true labor.   °· The contractions are usually irregular.   °· The contractions are often felt in the front of the lower abdomen and in the groin.   °· The contractions may go away when you walk around or change positions while lying down.   °· The contractions get weaker and are shorter lasting as time goes on.   °· The contractions do not usually become progressively stronger, regular, and closer together as with true labor.   °True Labor  °· Contractions in true labor last 30-70 seconds, become very regular, usually become more intense, and increase in frequency.   °· The contractions do not go away with walking.   °· The discomfort is usually felt in the top of the uterus and spreads to the lower abdomen and low back.   °· True labor can be  determined by your health care provider with an exam. This will show that the cervix is dilating and getting thinner.   °WHAT TO REMEMBER °· Keep up with your usual exercises and follow other instructions given by your health care provider.   °· Take medicines as directed by your health care provider.   °· Keep your regular prenatal appointments.   °· Eat and drink lightly if you think you are going into labor.   °· If Braxton Hicks contractions are making you uncomfortable:   °¨ Change your position from lying down or resting to walking, or from walking to resting.   °¨ Sit and rest in a tub of warm water.   °¨ Drink 2-3 glasses of water. Dehydration may cause these contractions.   °¨ Do slow and deep breathing several times an hour.   °WHEN SHOULD I SEEK IMMEDIATE MEDICAL CARE? °Seek immediate medical care if: °· Your contractions become stronger, more regular, and closer together.   °· You have fluid leaking or gushing from your vagina.   °· You have a fever.   °· You pass blood-tinged mucus.   °· You have vaginal bleeding.   °· You have continuous abdominal pain.   °· You have low back pain that you never had before.   °· You feel your baby's head pushing down and causing pelvic pressure.   °· Your baby is not moving as much as it used to.   °This information is not intended to replace advice given to you by your health care provider. Make sure you discuss any questions you have with your health care   provider. °Document Released: 06/19/2005 Document Revised: 10/11/2015 Document Reviewed: 03/31/2013 °Elsevier Interactive Patient Education © 2017 Elsevier Inc. ° °

## 2016-05-24 NOTE — MAU Provider Note (Signed)
History   [id number]   Chief Complaint  Patient presents with  . Vaginal Discharge  . Abdominal Cramping    HPI Lorraine Rogers is a 25 y.o. female  G3P1011 here with report of watery vaginal discharge that began at approximately 1300 today.  Leaking of fluid has not continued.  Pt denies contractions and denies vaginal bleeding. No recent intercourse. Positive fetal movement.   All other systems negative.    Patient's last menstrual period was 08/22/2015.  OB History  Gravida Para Term Preterm AB Living  3 1 1   1 1   SAB TAB Ectopic Multiple Live Births  1       1    # Outcome Date GA Lbr Len/2nd Weight Sex Delivery Anes PTL Lv  3 Current           2 SAB 2016 8253w0d         1 Term 03/24/11 118w5d 15:52 / 00:34 8 lb 5.3 oz (3.779 kg) M Vag-Spont EPI  LIV      Past Medical History:  Diagnosis Date  . Medical history non-contributory   . No pertinent past medical history   . Tonsillitis, chronic   . Trichomoniasis of vagina     Family History  Problem Relation Age of Onset  . Diabetes Mother     Social History   Social History  . Marital status: Single    Spouse name: N/A  . Number of children: N/A  . Years of education: N/A   Social History Main Topics  . Smoking status: Current Every Day Smoker    Packs/day: 0.25    Types: Cigarettes    Last attempt to quit: 10/21/2015  . Smokeless tobacco: Never Used  . Alcohol use 0.6 oz/week    1 Shots of liquor per week     Comment: social-stopped 10/2015  . Drug use: No     Comment: stopped when found out pregnant 10/2015  . Sexual activity: Yes    Birth control/ protection: None   Other Topics Concern  . None   Social History Narrative  . None    Allergies  Allergen Reactions  . Penicillins Hives and Rash    Has patient had a PCN reaction causing immediate rash, facial/tongue/throat swelling, SOB or lightheadedness with hypotension: Yes Has patient had a PCN reaction causing severe rash involving mucus membranes  or skin necrosis: No Has patient had a PCN reaction that required hospitalization Yes Has patient had a PCN reaction occurring within the last 10 years: Yes If all of the above answers are "NO", then may proceed with Cephalosporin use.  . Sulfa Antibiotics Hives and Rash    No current facility-administered medications on file prior to encounter.    Current Outpatient Prescriptions on File Prior to Encounter  Medication Sig Dispense Refill  . Prenatal Multivit-Min-Fe-FA (PRENATAL VITAMINS PO) Take 1 tablet by mouth daily.       Review of Systems  Constitutional: Negative.   Gastrointestinal: Negative.   Genitourinary: Positive for vaginal discharge. Negative for vaginal bleeding.     Physical Exam   Vitals:   05/24/16 1626  BP: 118/56  Pulse: 94  Resp: 18  Temp: 98.5 F (36.9 C)  TempSrc: Oral  Weight: 249 lb 12.8 oz (113.3 kg)    Physical Exam  Nursing note and vitals reviewed. Constitutional: She is oriented to person, place, and time. She appears well-developed and well-nourished. No distress.  HENT:  Head: Normocephalic and atraumatic.  Eyes: Conjunctivae  are normal. Right eye exhibits no discharge. Left eye exhibits no discharge. No scleral icterus.  Neck: Normal range of motion.  Respiratory: Effort normal. No respiratory distress.  Genitourinary: No bleeding in the vagina. Vaginal discharge (small amount of thick white discharge) found.  Genitourinary Comments: No pooling  Neurological: She is alert and oriented to person, place, and time.  Skin: Skin is warm and dry. She is not diaphoretic.  Psychiatric: She has a normal mood and affect. Her behavior is normal. Judgment and thought content normal.   Fetal Tracing:  Baseline: 130 Variability: moderate Accelerations: 15x15 Decelerations: none  Toco: none MAU Course  Procedures Results for orders placed or performed during the hospital encounter of 05/24/16 (from the past 24 hour(s))  Wet prep, genital      Status: Abnormal   Collection Time: 05/24/16  5:36 PM  Result Value Ref Range   Yeast Wet Prep HPF POC NONE SEEN NONE SEEN   Trich, Wet Prep NONE SEEN NONE SEEN   Clue Cells Wet Prep HPF POC NONE SEEN NONE SEEN   WBC, Wet Prep HPF POC MODERATE (A) NONE SEEN   Sperm NONE SEEN   Fern Test     Status: None   Collection Time: 05/24/16  5:57 PM  Result Value Ref Range   POCT Fern Test Negative = intact amniotic membranes     MDM Reactive fetal tracing No pooling & fern negative Wet prep negative for infection  Assessment and Plan  A: 1. Vaginal discharge during pregnancy in third trimester    P: Discharge home Keep scheduled f/u with ob Discussed reasons to return to MAU   Judeth HornErin Niya Behler, NP 05/24/2016 5:25 PM

## 2016-05-24 NOTE — MAU Note (Signed)
Felt a big gush after she got dressed.  When she went to check, there was a lot of mucous.no bleeding,some cramping.

## 2016-05-25 ENCOUNTER — Inpatient Hospital Stay (HOSPITAL_COMMUNITY)
Admission: AD | Admit: 2016-05-25 | Discharge: 2016-05-28 | DRG: 775 | Disposition: A | Discharge status: Home / Self Care | Payer: 59 | Source: Ambulatory Visit | Attending: Obstetrics and Gynecology | Admitting: Obstetrics and Gynecology

## 2016-05-25 ENCOUNTER — Encounter (HOSPITAL_COMMUNITY): Payer: Self-pay | Admitting: *Deleted

## 2016-05-25 DIAGNOSIS — Z3A37 37 weeks gestation of pregnancy: Secondary | ICD-10-CM

## 2016-05-25 DIAGNOSIS — N898 Other specified noninflammatory disorders of vagina: Secondary | ICD-10-CM | POA: Diagnosis not present

## 2016-05-25 DIAGNOSIS — O3663X0 Maternal care for excessive fetal growth, third trimester, not applicable or unspecified: Principal | ICD-10-CM | POA: Diagnosis present

## 2016-05-25 DIAGNOSIS — Z3493 Encounter for supervision of normal pregnancy, unspecified, third trimester: Secondary | ICD-10-CM | POA: Diagnosis present

## 2016-05-25 DIAGNOSIS — O26843 Uterine size-date discrepancy, third trimester: Secondary | ICD-10-CM | POA: Diagnosis present

## 2016-05-25 DIAGNOSIS — O99214 Obesity complicating childbirth: Secondary | ICD-10-CM | POA: Diagnosis present

## 2016-05-25 DIAGNOSIS — O99334 Smoking (tobacco) complicating childbirth: Secondary | ICD-10-CM | POA: Diagnosis present

## 2016-05-25 DIAGNOSIS — Z833 Family history of diabetes mellitus: Secondary | ICD-10-CM | POA: Diagnosis not present

## 2016-05-25 DIAGNOSIS — Z6839 Body mass index (BMI) 39.0-39.9, adult: Secondary | ICD-10-CM | POA: Diagnosis not present

## 2016-05-25 DIAGNOSIS — F17210 Nicotine dependence, cigarettes, uncomplicated: Secondary | ICD-10-CM | POA: Diagnosis present

## 2016-05-25 DIAGNOSIS — O26893 Other specified pregnancy related conditions, third trimester: Secondary | ICD-10-CM | POA: Diagnosis not present

## 2016-05-25 MED ORDER — FENTANYL 2.5 MCG/ML BUPIVACAINE 1/10 % EPIDURAL INFUSION (WH - ANES)
14.0000 mL/h | INTRAMUSCULAR | Status: DC | PRN
  Administered 2016-05-26: 14 mL/h via EPIDURAL
  Filled 2016-05-25: qty 100

## 2016-05-25 MED ORDER — EPHEDRINE 5 MG/ML INJ
10.0000 mg | INTRAVENOUS | Status: DC | PRN
  Filled 2016-05-25: qty 4

## 2016-05-25 MED ORDER — LIDOCAINE HCL (PF) 1 % IJ SOLN
30.0000 mL | INTRAMUSCULAR | Status: DC | PRN
  Filled 2016-05-25: qty 30

## 2016-05-25 MED ORDER — ACETAMINOPHEN 325 MG PO TABS: 650.0000 mg | ORAL_TABLET | ORAL | Status: DC | PRN

## 2016-05-25 MED ORDER — ONDANSETRON HCL 4 MG/2ML IJ SOLN: 4.0000 mg | Freq: Four times a day (QID) | INTRAMUSCULAR | Status: DC | PRN

## 2016-05-25 MED ORDER — SOD CITRATE-CITRIC ACID 500-334 MG/5ML PO SOLN: 30.0000 mL | ORAL | Status: DC | PRN

## 2016-05-25 MED ORDER — OXYCODONE-ACETAMINOPHEN 5-325 MG PO TABS: 2.0000 | ORAL_TABLET | ORAL | Status: DC | PRN

## 2016-05-25 MED ORDER — PHENYLEPHRINE 40 MCG/ML (10ML) SYRINGE FOR IV PUSH (FOR BLOOD PRESSURE SUPPORT)
80.0000 ug | PREFILLED_SYRINGE | INTRAVENOUS | Status: DC | PRN
  Filled 2016-05-25: qty 5
  Filled 2016-05-25: qty 10

## 2016-05-25 MED ORDER — LACTATED RINGERS IV SOLN
500.0000 mL | Freq: Once | INTRAVENOUS | Status: AC
  Administered 2016-05-26: 500 mL via INTRAVENOUS

## 2016-05-25 MED ORDER — OXYTOCIN 40 UNITS IN LACTATED RINGERS INFUSION - SIMPLE MED
2.5000 [IU]/h | INTRAVENOUS | Status: DC
  Filled 2016-05-25: qty 1000

## 2016-05-25 MED ORDER — OXYTOCIN BOLUS FROM INFUSION
500.0000 mL | Freq: Once | INTRAVENOUS | Status: AC
  Administered 2016-05-26: 500 mL via INTRAVENOUS

## 2016-05-25 MED ORDER — LACTATED RINGERS IV SOLN
INTRAVENOUS | Status: DC
  Administered 2016-05-26: 04:00:00 via INTRAVENOUS

## 2016-05-25 MED ORDER — LACTATED RINGERS IV SOLN
500.0000 mL | INTRAVENOUS | Status: DC | PRN
Start: 2016-05-25 — End: 2016-05-26

## 2016-05-25 MED ORDER — PHENYLEPHRINE 40 MCG/ML (10ML) SYRINGE FOR IV PUSH (FOR BLOOD PRESSURE SUPPORT)
80.0000 ug | PREFILLED_SYRINGE | INTRAVENOUS | Status: DC | PRN
  Filled 2016-05-25: qty 5

## 2016-05-25 MED ORDER — DIPHENHYDRAMINE HCL 50 MG/ML IJ SOLN: 12.5000 mg | INTRAMUSCULAR | Status: DC | PRN

## 2016-05-25 MED ORDER — OXYCODONE-ACETAMINOPHEN 5-325 MG PO TABS: 1.0000 | ORAL_TABLET | ORAL | Status: DC | PRN

## 2016-05-25 MED ORDER — FLEET ENEMA 7-19 GM/118ML RE ENEM: 1.0000 | ENEMA | RECTAL | Status: DC | PRN

## 2016-05-25 NOTE — MAU Note (Signed)
Pt reports contractions 
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
## 2016-05-25 NOTE — MAU Note (Signed)
Pt reports UC's for 2 hours

## 2016-05-26 ENCOUNTER — Inpatient Hospital Stay (HOSPITAL_COMMUNITY): Payer: 59 | Admitting: Anesthesiology

## 2016-05-26 ENCOUNTER — Encounter (HOSPITAL_COMMUNITY): Payer: Self-pay | Admitting: Anesthesiology

## 2016-05-26 DIAGNOSIS — Z3A37 37 weeks gestation of pregnancy: Secondary | ICD-10-CM

## 2016-05-26 DIAGNOSIS — O3663X0 Maternal care for excessive fetal growth, third trimester, not applicable or unspecified: Secondary | ICD-10-CM

## 2016-05-26 LAB — TYPE AND SCREEN
ABO/RH(D): A POS
ANTIBODY SCREEN: NEGATIVE

## 2016-05-26 LAB — CBC
HCT: 31.4 % — ABNORMAL LOW (ref 36.0–46.0)
HEMOGLOBIN: 10.5 g/dL — AB (ref 12.0–15.0)
MCH: 28.8 pg (ref 26.0–34.0)
MCHC: 33.4 g/dL (ref 30.0–36.0)
MCV: 86 fL (ref 78.0–100.0)
PLATELETS: 372 10*3/uL (ref 150–400)
RBC: 3.65 MIL/uL — AB (ref 3.87–5.11)
RDW: 13.4 % (ref 11.5–15.5)
WBC: 14.2 10*3/uL — AB (ref 4.0–10.5)

## 2016-05-26 LAB — RPR: RPR: NONREACTIVE

## 2016-05-26 MED ORDER — TETANUS-DIPHTH-ACELL PERTUSSIS 5-2.5-18.5 LF-MCG/0.5 IM SUSP: 0.5000 mL | Freq: Once | INTRAMUSCULAR | Status: DC

## 2016-05-26 MED ORDER — COCONUT OIL OIL: 1.0000 "application " | TOPICAL_OIL | Status: DC | PRN

## 2016-05-26 MED ORDER — ONDANSETRON HCL 4 MG PO TABS: 4.0000 mg | ORAL_TABLET | ORAL | Status: DC | PRN

## 2016-05-26 MED ORDER — PRENATAL MULTIVITAMIN CH
1.0000 | ORAL_TABLET | Freq: Every day | ORAL | Status: DC
  Administered 2016-05-26 – 2016-05-28 (×3): 1 via ORAL
  Filled 2016-05-26 (×3): qty 1

## 2016-05-26 MED ORDER — LIDOCAINE HCL (PF) 1 % IJ SOLN
INTRAMUSCULAR | Status: DC | PRN
  Administered 2016-05-26 (×2): 7 mL via EPIDURAL

## 2016-05-26 MED ORDER — DIPHENHYDRAMINE HCL 25 MG PO CAPS: 25.0000 mg | ORAL_CAPSULE | Freq: Four times a day (QID) | ORAL | Status: DC | PRN

## 2016-05-26 MED ORDER — SENNOSIDES-DOCUSATE SODIUM 8.6-50 MG PO TABS
2.0000 | ORAL_TABLET | ORAL | Status: DC
  Administered 2016-05-27 (×2): 2 via ORAL
  Filled 2016-05-26 (×3): qty 2

## 2016-05-26 MED ORDER — WITCH HAZEL-GLYCERIN EX PADS: 1.0000 "application " | MEDICATED_PAD | CUTANEOUS | Status: DC | PRN

## 2016-05-26 MED ORDER — DIBUCAINE 1 % RE OINT: 1.0000 "application " | TOPICAL_OINTMENT | RECTAL | Status: DC | PRN

## 2016-05-26 MED ORDER — IBUPROFEN 600 MG PO TABS
600.0000 mg | ORAL_TABLET | Freq: Four times a day (QID) | ORAL | Status: DC
  Administered 2016-05-26 – 2016-05-28 (×9): 600 mg via ORAL
  Filled 2016-05-26 (×9): qty 1

## 2016-05-26 MED ORDER — BENZOCAINE-MENTHOL 20-0.5 % EX AERO
1.0000 | INHALATION_SPRAY | CUTANEOUS | Status: DC | PRN
Start: 2016-05-26 — End: 2016-05-28

## 2016-05-26 MED ORDER — SIMETHICONE 80 MG PO CHEW: 80.0000 mg | CHEWABLE_TABLET | ORAL | Status: DC | PRN

## 2016-05-26 MED ORDER — ZOLPIDEM TARTRATE 5 MG PO TABS: 5.0000 mg | ORAL_TABLET | Freq: Every evening | ORAL | Status: DC | PRN

## 2016-05-26 MED ORDER — ACETAMINOPHEN 325 MG PO TABS: 650.0000 mg | ORAL_TABLET | ORAL | Status: DC | PRN

## 2016-05-26 MED ORDER — ONDANSETRON HCL 4 MG/2ML IJ SOLN: 4.0000 mg | INTRAMUSCULAR | Status: DC | PRN

## 2016-05-26 NOTE — Anesthesia Preprocedure Evaluation (Signed)
Anesthesia Evaluation  Patient identified by MRN, date of birth, ID band Patient awake    Reviewed: Allergy & Precautions, H&P , NPO status , Patient's Chart, lab work & pertinent test results  Airway Mallampati: II  TM Distance: >3 FB Neck ROM: full    Dental no notable dental hx.    Pulmonary neg pulmonary ROS, Current Smoker,    Pulmonary exam normal        Cardiovascular negative cardio ROS Normal cardiovascular exam     Neuro/Psych negative neurological ROS  negative psych ROS   GI/Hepatic negative GI ROS, Neg liver ROS,   Endo/Other  Morbid obesity  Renal/GU negative Renal ROS     Musculoskeletal   Abdominal (+) + obese,   Peds  Hematology negative hematology ROS (+)   Anesthesia Other Findings   Reproductive/Obstetrics (+) Pregnancy                             Anesthesia Physical Anesthesia Plan  ASA: III  Anesthesia Plan: Epidural   Post-op Pain Management:    Induction:   Airway Management Planned:   Additional Equipment:   Intra-op Plan:   Post-operative Plan:   Informed Consent: I have reviewed the patients History and Physical, chart, labs and discussed the procedure including the risks, benefits and alternatives for the proposed anesthesia with the patient or authorized representative who has indicated his/her understanding and acceptance.     Plan Discussed with:   Anesthesia Plan Comments:         Anesthesia Quick Evaluation

## 2016-05-26 NOTE — Anesthesia Procedure Notes (Signed)
Epidural Patient location during procedure: OB Start time: 05/26/2016 1:06 AM End time: 05/26/2016 1:10 AM  Staffing Anesthesiologist: Leilani AbleHATCHETT, Bostyn Bogie Performed: anesthesiologist   Preanesthetic Checklist Completed: patient identified, surgical consent, pre-op evaluation, timeout performed, IV checked, risks and benefits discussed and monitors and equipment checked  Epidural Patient position: sitting Prep: site prepped and draped and DuraPrep Patient monitoring: continuous pulse ox and blood pressure Approach: midline Location: L3-L4 Injection technique: LOR air  Needle:  Needle type: Tuohy  Needle gauge: 17 G Needle length: 9 cm and 9 Needle insertion depth: 8 cm Catheter type: closed end flexible Catheter size: 19 Gauge Catheter at skin depth: 13 cm Test dose: negative and Other  Assessment Sensory level: T9 Events: blood not aspirated, injection not painful, no injection resistance, negative IV test and no paresthesia

## 2016-05-26 NOTE — Lactation Note (Signed)
This note was copied from a baby's chart. Lactation Consultation Note  P2, EX BF for one year.  Baby 8 hours old and sleepy STS on mother's chest after bath. Mother's nipples evert and compressible. Reviewed hand expression with mother w/ teachback.  No drops expressed. Attemtped waking baby to bf but baby sleepy. Encouraged mother to continue STS until baby starts showing feeding cues. Mom encouraged to feed baby 8-12 times/24 hours and with feeding cues.  Mom made aware of O/P services, breastfeeding support groups, community resources, and our phone # for post-discharge questions.     Patient Name: Lorraine Rogers RUEAV'WToday's Date: 05/26/2016 Reason for consult: Initial assessment   Maternal Data Has patient been taught Hand Expression?: Yes Does the patient have breastfeeding experience prior to this delivery?: Yes  Feeding Feeding Type: Breast Fed  LATCH Score/Interventions                      Lactation Tools Discussed/Used     Consult Status Consult Status: Follow-up Date: 05/27/16 Follow-up type: In-patient    Dahlia ByesBerkelhammer, [facility] 05/26/2016, 2:05 PM

## 2016-05-26 NOTE — Anesthesia Postprocedure Evaluation (Signed)
Anesthesia Post Note  Patient: Lorraine LankDecoya Rogers  Procedure(s) Performed: * No procedures listed *  Patient location during evaluation: Mother Baby Anesthesia Type: Epidural Level of consciousness: awake and alert and patient cooperative Pain management: pain level controlled Vital Signs Assessment: post-procedure vital signs reviewed and stable Respiratory status: spontaneous breathing Cardiovascular status: blood pressure returned to baseline Postop Assessment: no headache, no backache and epidural receding Anesthetic complications: no     Last Vitals:  Vitals:   05/26/16 0751 05/26/16 0804  BP: 118/62 109/62  Pulse: 85 76  Resp:  18  Temp:  36.8 C    Last Pain:  Vitals:   05/26/16 1054  TempSrc:   PainSc: 0-No pain   Pain Goal:                 Mikal Blasdell C

## 2016-05-27 LAB — CBC
HCT: 30.2 % — ABNORMAL LOW (ref 36.0–46.0)
HEMOGLOBIN: 10.3 g/dL — AB (ref 12.0–15.0)
MCH: 29.1 pg (ref 26.0–34.0)
MCHC: 34.1 g/dL (ref 30.0–36.0)
MCV: 85.3 fL (ref 78.0–100.0)
PLATELETS: 311 10*3/uL (ref 150–400)
RBC: 3.54 MIL/uL — AB (ref 3.87–5.11)
RDW: 13.5 % (ref 11.5–15.5)
WBC: 13.2 10*3/uL — AB (ref 4.0–10.5)

## 2016-05-27 NOTE — Progress Notes (Signed)
Post Partum Day 1 Subjective: no complaints, up ad lib and tolerating PO  Objective: Blood pressure 114/66, pulse (!) 53, temperature 98.4 F (36.9 C), temperature source Oral, resp. rate 20, height 5\' 7"  (1.702 m), weight 112.9 kg (249 lb), last menstrual period 08/22/2015, SpO2 99 %, unknown if currently breastfeeding.  Physical Exam:  General: alert, cooperative and no distress Lochia: appropriate Uterine Fundus: firm Incision:  DVT Evaluation: No evidence of DVT seen on physical exam.   Recent Labs  05/26/16 0015 05/27/16 0516  HGB 10.5* 10.3*  HCT 31.4* 30.2*    Assessment/Plan: Plan for discharge tomorrow, Breastfeeding and Contraception mirena   LOS: 2 days   Luwana Butrick V 05/27/2016, 8:07 PM

## 2016-05-27 NOTE — Lactation Note (Signed)
This note was copied from a baby's chart. Lactation Consultation Note Mom had company. When entered rm. Mom appeared to had slightly rolled her eyes then turned her head away. LC introduced myself and asked how BF was going. Mom replied "it aint". Mom states baby doesn't stay feeding on breast long then falls a sleep. LC informed mom that was normal, just need to stimulate baby. Mom has been giving formula in bottles. Mom is breast feeding occasionally.  Asked mom if she would like to call LC to assist for next feeding, mom stated yes she would call.   Patient Name: Lorraine Rogers LankDecoya Cozby WUJWJ'XToday's Date: 05/27/2016 Reason for consult: Follow-up assessment   Maternal Data    Feeding    LATCH Score/Interventions                      Lactation Tools Discussed/Used     Consult Status Consult Status: Follow-up Date: 05/28/16 Follow-up type: In-patient    Charyl DancerCARVER, Teleah Villamar G 05/27/2016, 3:13 PM

## 2016-05-27 NOTE — Progress Notes (Signed)
Post Partum Day 1 Subjective: no complaints, up ad lib and tolerating PO  Objective: Blood pressure 114/66, pulse (!) 53, temperature 98.4 F (36.9 C), temperature source Oral, resp. rate 20, height 5\' 7"  (1.702 m), weight 112.9 kg (249 lb), last menstrual period 08/22/2015, SpO2 99 %, unknown if currently breastfeeding. Bottle feeding at present may try attempt breast-feeding Again  Physical Exam:  General: alert, cooperative and no distress Lochia: appropriate Uterine Fundus: firm Incision:  DVT Evaluation: No evidence of DVT seen on physical exam.   Recent Labs  05/26/16 0015 05/27/16 0516  HGB 10.5* 10.3*  HCT 31.4* 30.2*    Assessment/Plan: Plan for discharge tomorrow, Breastfeeding and Contraception Mirena   LOS: 2 days   Contessa Preuss V 05/27/2016, 10:47 PM

## 2016-05-28 MED ORDER — IBUPROFEN 600 MG PO TABS: 600.0000 mg | ORAL_TABLET | Freq: Four times a day (QID) | ORAL | 0 refills | Status: DC

## 2016-05-28 NOTE — Discharge Instructions (Signed)

## 2016-05-28 NOTE — Discharge Summary (Signed)
OB Discharge Summary     Patient Name: Lorraine Rogers DOB: 01/02/1991 MRN: [id number]  Date of admission: 05/25/2016 Delivering MD: Casey BurkittFITZGERALD, HILLARY MOEN   Date of discharge: 05/28/2016  Admitting diagnosis: LABOR Intrauterine pregnancy: 6330w5d     Secondary diagnosis:  Active Problems:   Indication for care in labor or delivery  Additional problems: none     Discharge diagnosis: Term Pregnancy Delivered                                                                                                Post partum procedures:none  Augmentation: none  Complications: None  Hospital course:  Onset of Labor With Vaginal Delivery     25 y.o. yo [id number] at 5430w5d was admitted in Active Labor on 05/25/2016. Patient had an uncomplicated labor course as follows:  Membrane Rupture Time/Date: 5:28 AM ,05/26/2016   Intrapartum Procedures: Episiotomy: None [1]                                         Lacerations:  None [1]  Patient had a delivery of a Viable infant. 05/26/2016  Information for the patient's newborn:  Noralee SpaceHeath, Girl Sherel [[id number]][[id number]]  Delivery Method: Vaginal, Spontaneous Delivery (Filed from Delivery Summary)    Pateint had an uncomplicated postpartum course.  She is ambulating, tolerating a regular diet, passing flatus, and urinating well. Patient is discharged home in stable condition on 05/28/16.    Physical exam Vitals:   05/26/16 2100 05/27/16 0546 05/27/16 1738 05/28/16 0700  BP: 119/68 109/62 114/66 121/69  Pulse: (!) 48 (!) 55 (!) 53 67  Resp: 18 18 20 18   Temp: 98.3 F (36.8 C) 98.1 F (36.7 C) 98.4 F (36.9 C) 97.8 F (36.6 C)  TempSrc: Oral Oral Oral Oral  SpO2:      Weight:      Height:       General: alert and cooperative Lochia: appropriate Uterine Fundus: firm Incision: N/A DVT Evaluation: No evidence of DVT seen on physical exam. Labs: Lab Results  Component Value Date   WBC 13.2 (H) 05/27/2016   HGB 10.3 (L) 05/27/2016   HCT 30.2 (L)  05/27/2016   MCV 85.3 05/27/2016   PLT 311 05/27/2016   CMP Latest Ref Rng & Units 04/26/2016  Glucose 65 - 99 mg/dL 130(Q102(H)  BUN 6 - 20 mg/dL 5(L)  Creatinine 6.570.44 - 1.00 mg/dL 8.460.50  Sodium 962135 - 952145 mmol/L 135  Potassium 3.5 - 5.1 mmol/L 4.0  Chloride 101 - 111 mmol/L 107  CO2 22 - 32 mmol/L 20(L)  Calcium 8.9 - 10.3 mg/dL 9.1  Total Protein 6.5 - 8.1 g/dL 6.3(L)  Total Bilirubin 0.3 - 1.2 mg/dL 0.6  Alkaline Phos 38 - 126 U/L 69  AST 15 - 41 U/L 49(H)  ALT 14 - 54 U/L 23    Discharge instruction: per After Visit Summary and "Baby and Me Booklet".  After visit meds:    Medication List    TAKE these medications  ibuprofen 600 MG tablet Commonly known as:  ADVIL,MOTRIN Take 1 tablet (600 mg total) by mouth every 6 (six) hours.   PRENATAL GUMMIES/DHA & FA 0.4-32.5 MG Chew Chew 1 each by mouth daily.       Diet: routine diet  Activity: Advance as tolerated. Pelvic rest for 6 weeks.   Outpatient follow up:6 weeks  Follow up Visit:No Follow-up on file.  Postpartum contraception: IUD Mirena  Newborn Data: Live born female  Birth Weight: 7 lb 4.2 oz (3294 g) APGAR: 7, 9  Baby Feeding: Bottle and Breast Disposition:home with mother   05/28/2016 Cam HaiSHAW, KIMBERLY, CNM  9:20 AM

## 2016-05-28 NOTE — Lactation Note (Signed)
This note was copied from a baby's chart. Lactation Consultation Note  Patient Name: Girl Denton LankDecoya Kirshenbaum ZOXWR'UToday's Date: 05/28/2016   Visited with Mom on day of discharge, baby 6952 hrs old.  Mom has been primarily bottle feeding because baby won't latch.  Offered assist, but Mom said she would try more at home.  Baby sleeping.  Talked about importance of regular double pumping along with breast massage and hand expression to support her milk supply.  Mom hasn't pumped while here in hospital.  Talked about our pump rental programs, and [facility].  Mom interested in obtaining a DEBP for home.  Also recommended she return for an OP lactation appointment once her milk volume is in.  Set up DEBP at bedside, and assisted with Mom's first pumping.  Breasts soft, and compressible with erect nipples. [facility] with instructions on assembly and care of pump parts, and milk storage.  Plan- STS, Try to BF every 2-3 hrs Supplement with 30 increasing to 60 ml as tolerated formula+/EBM Pump both breasts every 2-3 hrs  OP lactation appointment made for Friday, Dec 1st @ 10:30 am  Mom encouraged to call prior to discharge for assistance with latch, and after discharge for any questions.   Judee ClaraSmith, Kyrese Gartman E 05/28/2016, 10:06 AM

## 2016-05-30 ENCOUNTER — Ambulatory Visit (HOSPITAL_COMMUNITY): Payer: 59

## 2016-05-30 ENCOUNTER — Encounter: Payer: 59 | Admitting: Student

## 2016-06-02 ENCOUNTER — Ambulatory Visit (HOSPITAL_COMMUNITY): Admit: 2016-06-02 | Payer: 59

## 2016-06-27 ENCOUNTER — Ambulatory Visit: Payer: 59 | Admitting: Family Medicine

## 2016-06-28 ENCOUNTER — Encounter: Payer: Self-pay | Admitting: Obstetrics & Gynecology

## 2016-06-28 ENCOUNTER — Ambulatory Visit (INDEPENDENT_AMBULATORY_CARE_PROVIDER_SITE_OTHER): Payer: 59 | Admitting: Obstetrics & Gynecology

## 2016-06-28 DIAGNOSIS — Z30430 Encounter for insertion of intrauterine contraceptive device: Secondary | ICD-10-CM | POA: Diagnosis not present

## 2016-06-28 LAB — POCT PREGNANCY, URINE: PREG TEST UR: NEGATIVE

## 2016-06-28 MED ORDER — LEVONORGESTREL 18.6 MCG/DAY IU IUD
INTRAUTERINE_SYSTEM | Freq: Once | INTRAUTERINE | Status: AC
  Administered 2016-06-28: 1 via INTRAUTERINE

## 2016-06-28 NOTE — Addendum Note (Signed)
Addended by: Faythe CasaBELLAMY, Taren Toops M on: 06/28/2016 03:12 PM   Modules accepted: Orders

## 2016-06-28 NOTE — Progress Notes (Signed)
Subjective:     Lorraine Rogers is a 25 y.o. female who presents for a postpartum visit. She is 4 weeks postpartum following a spontaneous vaginal delivery. I have fully reviewed the prenatal and intrapartum course. The delivery was at 37 gestational weeks. Outcome: spontaneous vaginal delivery. Anesthesia: epidural. Postpartum course has been normal. Baby's course has been normal. Baby is feeding by bottle - Similac Advance. Bleeding no bleeding. Bowel function is normal. Bladder function is normal. Patient is not sexually active. Contraception method is abstinence. Postpartum depression screening: negative.  The following portions of the patient's history were reviewed and updated as appropriate: allergies, current medications, past family history, past medical history, past social history, past surgical history and problem list.  Review of Systems Pertinent items are noted in HPI.   Objective:    Wt 235 lb 8 oz (106.8 kg)   BMI 36.88 kg/m   General:  alert   Breasts:  inspection negative, no nipple discharge or bleeding, no masses or nodularity palpable  Lungs: clear to auscultation bilaterally  Heart:  regular rate and rhythm, S1, S2 normal, no murmur, click, rub or gallop  Abdomen: soft, non-tender; bowel sounds normal; no masses,  no organomegaly   Vulva:  normal  Vagina: normal vagina  Cervix:  anteverted  Corpus: normal  Adnexa:  normal adnexa  Rectal Exam: Not performed.       UPT negative, consent signed, Time out procedure done. Cervix prepped with betadine and grasped with a single tooth tenaculum. Liletta was easily placed and the strings were cut to 3-4 cm. Uterus sounded to 9 cm. She tolerated the procedure well.   Assessment:   Normal postpartum exam. Pap smear not done at today's visit.   Plan:    1. Contraception: IUD 2. Back up method for 2 weeks 3. Follow up in: 4 weeks for string check or as needed.

## 2016-07-28 ENCOUNTER — Ambulatory Visit: Payer: 59 | Admitting: Obstetrics & Gynecology

## 2016-11-11 IMAGING — US US OB COMP LESS 14 WK
1 series · 15 of 28 positions shown · non-contrast
Comparison: None.

CLINICAL DATA: Abdominal pain for 1 day. 1 episode of spotting.
Pregnant patient. Patient is 9 weeks and 4 days pregnant based on
the last menstrual period.

EXAM:
OBSTETRIC <14 WK US AND TRANSVAGINAL OB US
TECHNIQUE: Both transabdominal and transvaginal ultrasound examinations were
performed for complete evaluation of the gestation as well as the
maternal uterus, adnexal regions, and pelvic cul-de-sac.
Transvaginal technique was performed to assess early pregnancy.

[Series 1: us ob comp less 14 wk · 15 of 89 slices shown]
[im 1/89]
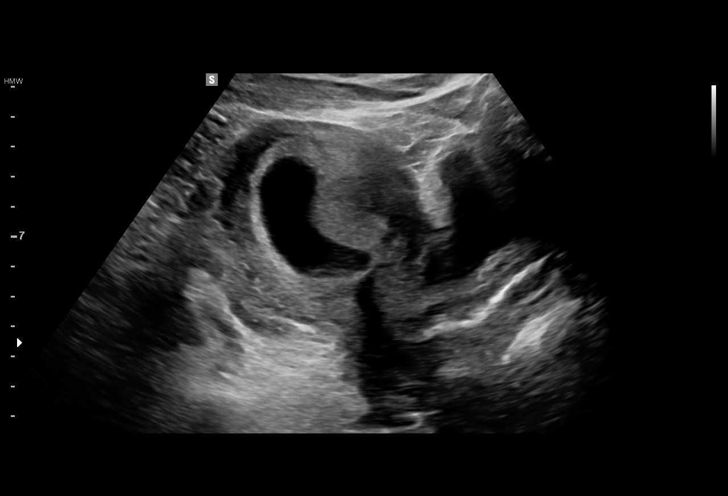
[im 7/89]
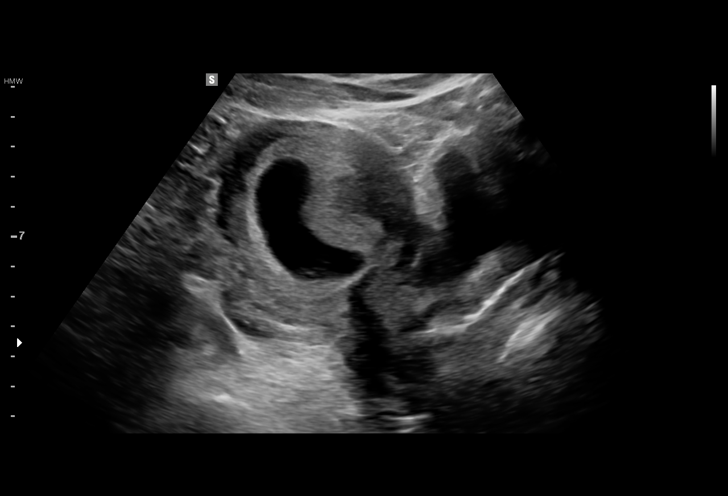
[im 14/89]
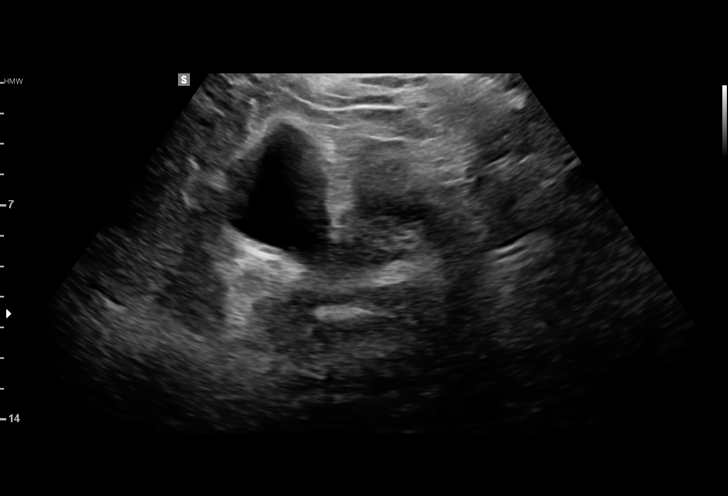
[im 20/89]
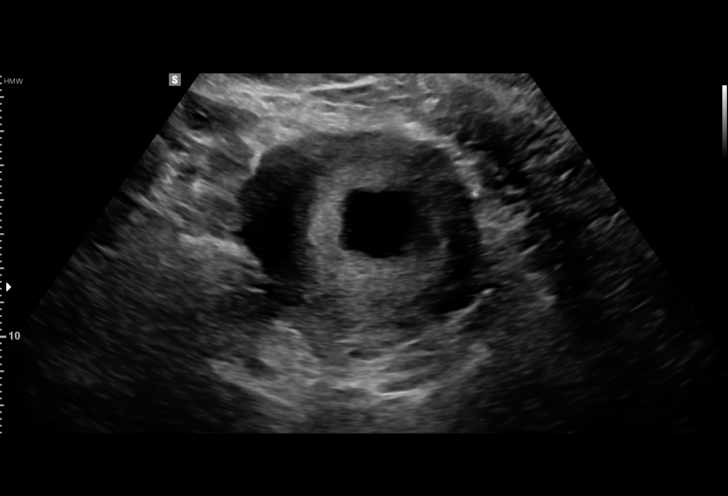
[im 27/89]
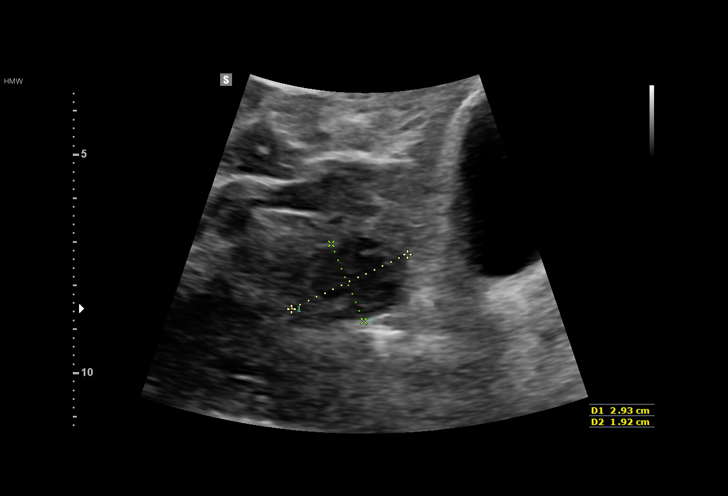
[im 33/89]
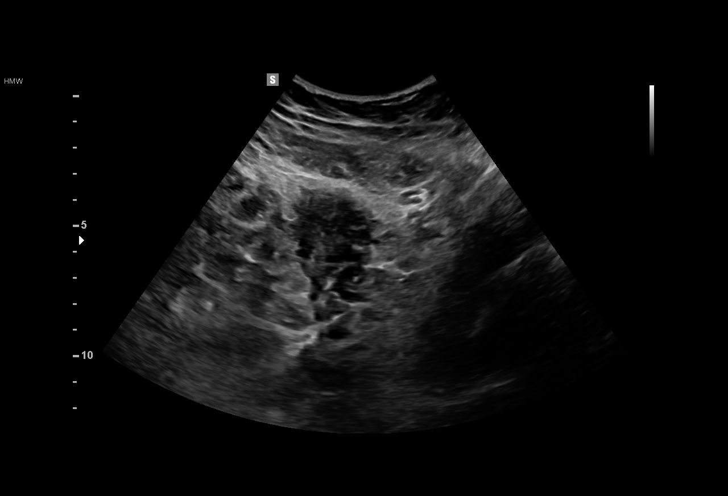
[im 40/89]
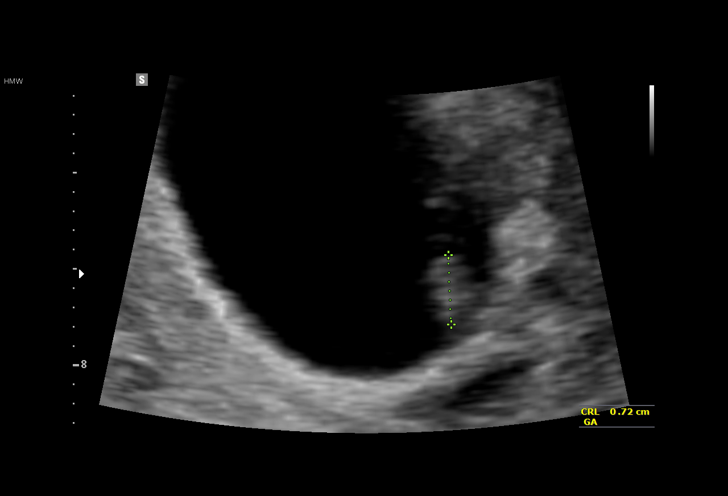
[im 46/89]
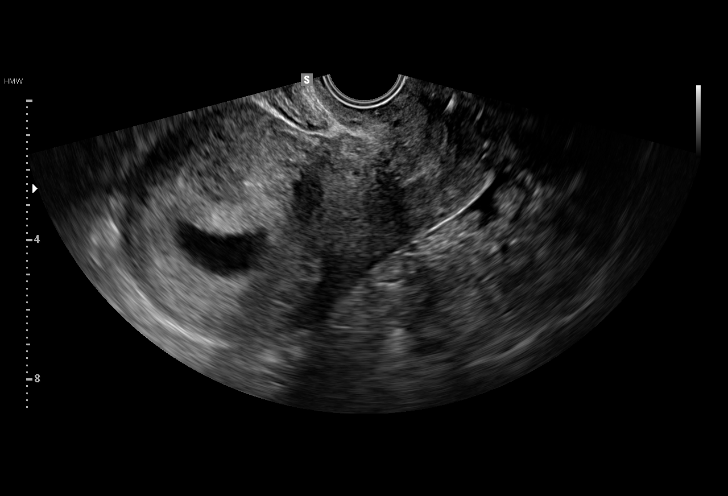
[im 49/89]
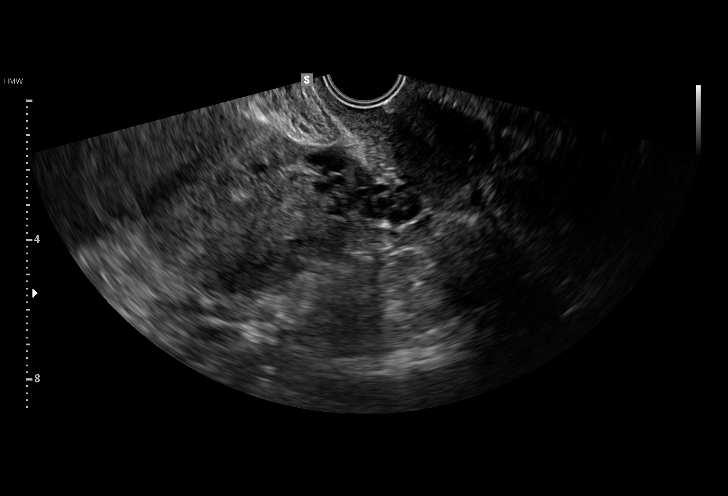
[im 56/89]
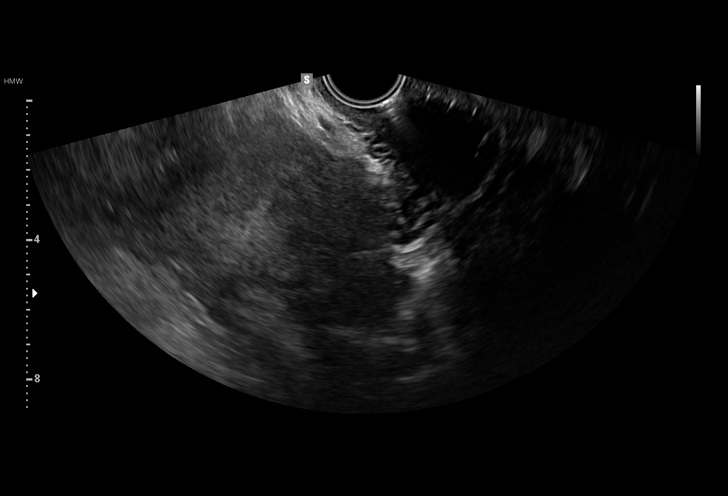
[im 62/89]
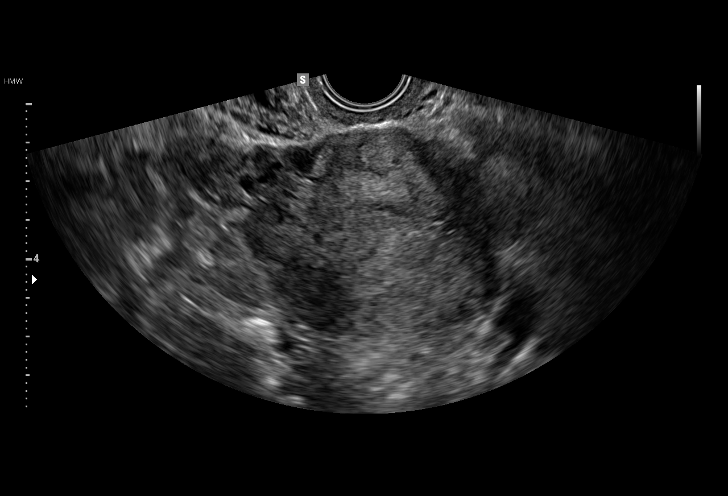
[im 69/89]
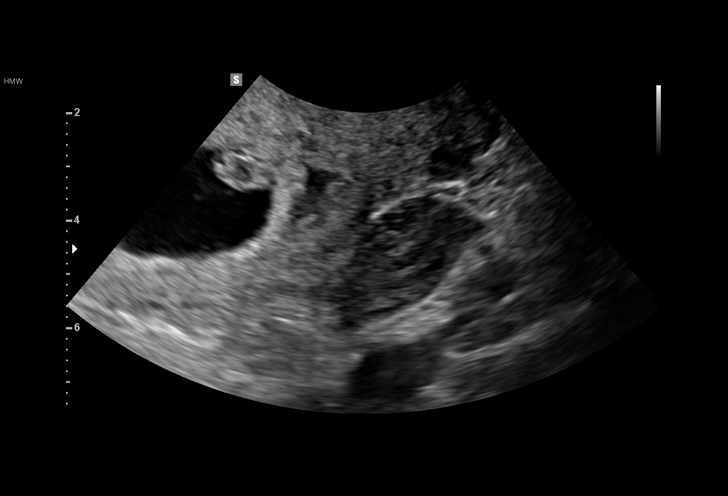
[im 75/89]
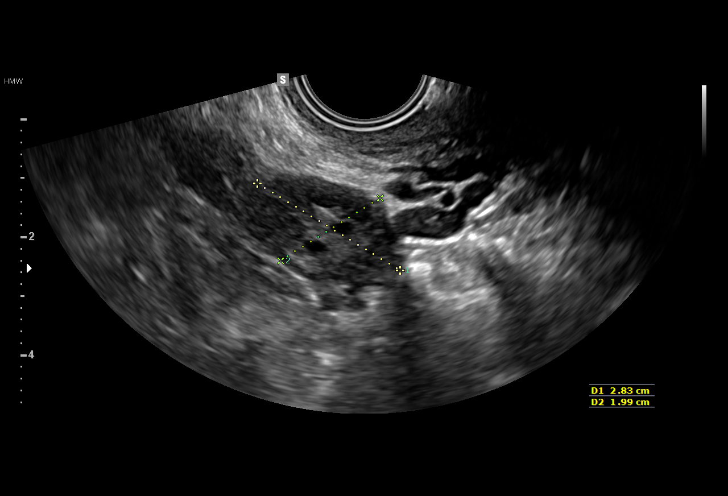
[im 82/89]
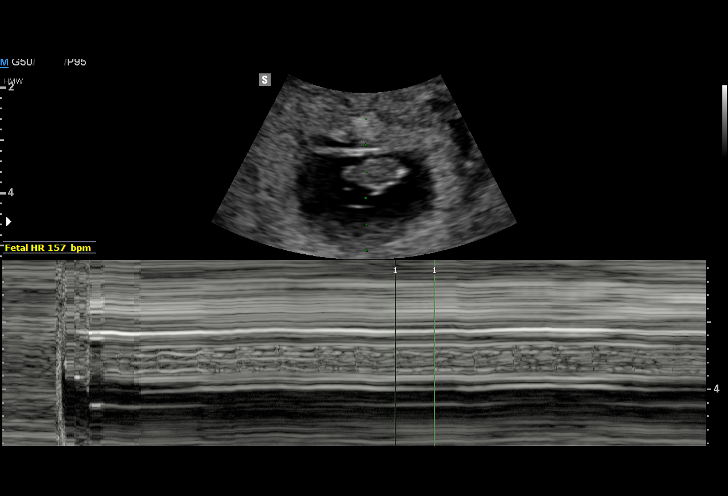
[im 89/89]
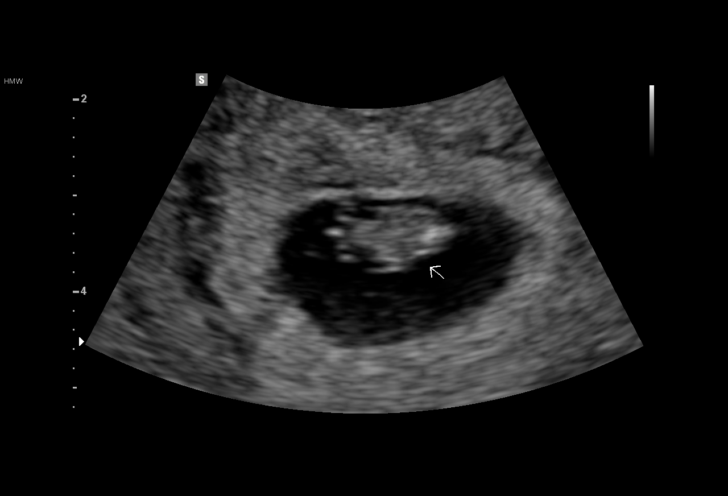

[15 of 28 positions shown; findings below may reference images not displayed]

FINDINGS: Intrauterine gestational sac: Yes

Yolk sac:  Yes

Embryo:  Yes

Cardiac Activity: Yes

Heart Rate: 157  bpm

CRL:  12.9  mm   7 w   4 d                  US EDC: 06/11/2016

Subchorionic hemorrhage: Small subchronic hemorrhage along the
inferior margin of the gestational sac.

Maternal uterus/adnexae: No uterine masses. Cervix is closed. Normal
ovaries and adnexa. Trace pelvic free fluid.
IMPRESSION: 1. Single live intrauterine pregnancy with a measured gestational
age is 7 weeks and 4 days.
2. Small subchronic hemorrhage. No other evidence of a pregnancy
complication.
3. Normal ovaries and adnexa.

## 2017-05-02 ENCOUNTER — Ambulatory Visit (INDEPENDENT_AMBULATORY_CARE_PROVIDER_SITE_OTHER): Payer: 59 | Admitting: Family Medicine

## 2017-05-02 ENCOUNTER — Encounter: Payer: Self-pay | Admitting: Family Medicine

## 2017-05-02 VITALS — BP 113/63 | HR 73 | Ht 67.0 in | Wt 248.7 lb

## 2017-05-02 DIAGNOSIS — Z309 Encounter for contraceptive management, unspecified: Secondary | ICD-10-CM | POA: Diagnosis not present

## 2017-05-02 DIAGNOSIS — Z30432 Encounter for removal of intrauterine contraceptive device: Secondary | ICD-10-CM

## 2017-05-02 MED ORDER — NORGESTIMATE-ETH ESTRADIOL 0.25-35 MG-MCG PO TABS: 1.0000 | ORAL_TABLET | Freq: Every day | ORAL | 11 refills | Status: DC

## 2017-05-02 NOTE — Progress Notes (Signed)
Patient desiring IUD removal due to pelvic pain both with intercourse and with everyday activities.  No abnormal discharge.  Offered trial of antibiotics prior to removal, however patient declined and wished for removal.  Would like to return to being on OCPs.  IUD Removal  Patient was in the dorsal lithotomy position, normal external genitalia was noted.  A speculum was placed in the patient's vagina, normal discharge was noted, no lesions. The multiparous cervix was visualized, no lesions, no abnormal discharge,  and was swabbed with Betadine using scopettes.  The strings of the IUD was grasped and pulled using ring forceps.  The IUD was successfully removed in its entirety.  Patient tolerated the procedure well.

## 2017-05-05 ENCOUNTER — Emergency Department (HOSPITAL_COMMUNITY)
Admission: EM | Admit: 2017-05-05 | Discharge: 2017-05-05 | Disposition: A | Discharge status: Home / Self Care | Payer: 59 | Attending: Emergency Medicine | Admitting: Emergency Medicine

## 2017-05-05 ENCOUNTER — Encounter (HOSPITAL_COMMUNITY): Payer: Self-pay | Admitting: Emergency Medicine

## 2017-05-05 DIAGNOSIS — M79605 Pain in left leg: Secondary | ICD-10-CM | POA: Diagnosis present

## 2017-05-05 DIAGNOSIS — Z79899 Other long term (current) drug therapy: Secondary | ICD-10-CM | POA: Diagnosis not present

## 2017-05-05 DIAGNOSIS — F17210 Nicotine dependence, cigarettes, uncomplicated: Secondary | ICD-10-CM | POA: Insufficient documentation

## 2017-05-05 DIAGNOSIS — L03115 Cellulitis of right lower limb: Secondary | ICD-10-CM | POA: Diagnosis not present

## 2017-05-05 LAB — CBC WITH DIFFERENTIAL/PLATELET
Basophils Absolute: 0 10*3/uL (ref 0.0–0.1)
Basophils Relative: 0 %
EOS ABS: 0.5 10*3/uL (ref 0.0–0.7)
Eosinophils Relative: 4 %
HEMATOCRIT: 36.5 % (ref 36.0–46.0)
HEMOGLOBIN: 11.9 g/dL — AB (ref 12.0–15.0)
LYMPHS ABS: 2.7 10*3/uL (ref 0.7–4.0)
Lymphocytes Relative: 22 %
MCH: 29.2 pg (ref 26.0–34.0)
MCHC: 32.6 g/dL (ref 30.0–36.0)
MCV: 89.7 fL (ref 78.0–100.0)
MONOS PCT: 6 %
Monocytes Absolute: 0.7 10*3/uL (ref 0.1–1.0)
NEUTROS ABS: 8.1 10*3/uL — AB (ref 1.7–7.7)
NEUTROS PCT: 68 %
Platelets: 229 10*3/uL (ref 150–400)
RBC: 4.07 MIL/uL (ref 3.87–5.11)
RDW: 13.9 % (ref 11.5–15.5)
WBC: 11.9 10*3/uL — ABNORMAL HIGH (ref 4.0–10.5)

## 2017-05-05 LAB — SEDIMENTATION RATE: Sed Rate: 27 mm/hr — ABNORMAL HIGH (ref 0–22)

## 2017-05-05 LAB — BASIC METABOLIC PANEL
ANION GAP: 9 (ref 5–15)
BUN: 9 mg/dL (ref 6–20)
CHLORIDE: 108 mmol/L (ref 101–111)
CO2: 19 mmol/L — AB (ref 22–32)
Calcium: 8.5 mg/dL — ABNORMAL LOW (ref 8.9–10.3)
Creatinine, Ser: 0.68 mg/dL (ref 0.44–1.00)
GFR calc non Af Amer: >60 mL/min (ref >60)
Glucose, Bld: 128 mg/dL — ABNORMAL HIGH (ref 65–99)
POTASSIUM: 3.4 mmol/L — AB (ref 3.5–5.1)
Sodium: 136 mmol/L (ref 135–145)

## 2017-05-05 MED ORDER — VANCOMYCIN HCL IN DEXTROSE 1-5 GM/200ML-% IV SOLN
1000.0000 mg | Freq: Once | INTRAVENOUS | Status: AC
  Administered 2017-05-05: 1000 mg via INTRAVENOUS
  Filled 2017-05-05: qty 200

## 2017-05-05 MED ORDER — CEPHALEXIN 500 MG PO CAPS: 500.0000 mg | ORAL_CAPSULE | Freq: Four times a day (QID) | ORAL | 0 refills | Status: DC

## 2017-05-05 MED ORDER — MORPHINE SULFATE (PF) 4 MG/ML IV SOLN
4.0000 mg | Freq: Once | INTRAVENOUS | Status: AC
  Administered 2017-05-05: 4 mg via INTRAVENOUS
  Filled 2017-05-05: qty 1

## 2017-05-05 MED ORDER — OXYCODONE-ACETAMINOPHEN 5-325 MG PO TABS: 1.0000 | ORAL_TABLET | ORAL | 0 refills | Status: DC | PRN

## 2017-05-05 NOTE — Discharge Instructions (Signed)
Return in two days for a recheck. If pain or swelling get worse before then, or if you start running a fever, then return to the Emergency Department.

## 2017-05-05 NOTE — ED Notes (Signed)
Pt able to ambulate to the bathroom w/o difficulty 
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
## 2017-05-05 NOTE — ED Notes (Signed)
Pt stable, ambulates without difficulty, and verbalizes understanding of meds and d/c instructions.
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
## 2017-07-03 NOTE — L&D Delivery Note (Signed)
Delivery Note Pt arrived to L&D just prior to 10am at 7cm and was complete just prior to delivery. At 10:53 AM a viable female was delivered via Vaginal, Spontaneous (Presentation: ROA) with membrane still around head, light MSF.  APGAR: 8, 9; weight: pending.   Placenta status: spont, intact .  Cord: 3 vessel  Anesthesia:  None Episiotomy: None Lacerations: None Est. Blood Loss (mL): 464  Mom to postpartum.  Baby to Couplet care / Skin to Skin.  Cam Hai CNM 05/10/2018, 11:13 AM  Please schedule this patient for Postpartum visit in: 4 weeks with the following provider: Any provider For C/S patients schedule nurse incision check in weeks 2 weeks: no Low risk pregnancy complicated by: late to care Delivery mode:  SVD Anticipated Birth Control:  IUD PP Procedures needed: none  Schedule Integrated BH visit: no

## 2017-08-09 ENCOUNTER — Ambulatory Visit (HOSPITAL_COMMUNITY)
Admission: EM | Admit: 2017-08-09 | Discharge: 2017-08-09 | Disposition: A | Discharge status: Home / Self Care | Payer: 59 | Attending: Family Medicine | Admitting: Family Medicine

## 2018-04-01 ENCOUNTER — Ambulatory Visit (INDEPENDENT_AMBULATORY_CARE_PROVIDER_SITE_OTHER): Payer: 59

## 2018-04-01 DIAGNOSIS — Z3201 Encounter for pregnancy test, result positive: Secondary | ICD-10-CM

## 2018-04-01 DIAGNOSIS — O3680X0 Pregnancy with inconclusive fetal viability, not applicable or unspecified: Secondary | ICD-10-CM

## 2018-04-01 NOTE — Progress Notes (Signed)
Pt here for UPT-Positive, pt not sure of last cycle , states either Feb or March. Scheduled Detail [place] to determine. Pt verbalized understanding advised to start prenatal vitamins.

## 2018-04-01 NOTE — Progress Notes (Signed)
I have reviewed the chart and agree with nursing staff's documentation of this patient's encounter.  Rumaysa Sabatino, CNM 04/01/2018 4:49 PM

## 2018-04-02 LAB — POCT PREGNANCY, URINE: PREG TEST UR: POSITIVE — AB

## 2018-04-05 ENCOUNTER — Encounter (HOSPITAL_COMMUNITY): Payer: Self-pay | Admitting: *Deleted

## 2018-04-09 ENCOUNTER — Encounter (HOSPITAL_COMMUNITY): Payer: Self-pay

## 2018-04-09 ENCOUNTER — Ambulatory Visit (HOSPITAL_COMMUNITY)
Admission: RE | Admit: 2018-04-09 | Discharge: 2018-04-09 | Disposition: A | Discharge status: Home / Self Care | Payer: 59 | Source: Ambulatory Visit | Attending: Advanced Practice Midwife | Admitting: Advanced Practice Midwife

## 2018-04-09 DIAGNOSIS — O0933 Supervision of pregnancy with insufficient antenatal care, third trimester: Secondary | ICD-10-CM | POA: Diagnosis not present

## 2018-04-09 DIAGNOSIS — Z363 Encounter for antenatal screening for malformations: Secondary | ICD-10-CM | POA: Insufficient documentation

## 2018-04-09 DIAGNOSIS — O99213 Obesity complicating pregnancy, third trimester: Secondary | ICD-10-CM | POA: Insufficient documentation

## 2018-04-09 DIAGNOSIS — O3680X0 Pregnancy with inconclusive fetal viability, not applicable or unspecified: Secondary | ICD-10-CM | POA: Insufficient documentation

## 2018-04-09 DIAGNOSIS — Z3A36 36 weeks gestation of pregnancy: Secondary | ICD-10-CM | POA: Insufficient documentation

## 2018-04-18 ENCOUNTER — Other Ambulatory Visit (HOSPITAL_COMMUNITY)
Admission: RE | Admit: 2018-04-18 | Discharge: 2018-04-18 | Disposition: A | Discharge status: Home / Self Care | Payer: 59 | Source: Ambulatory Visit | Attending: Obstetrics and Gynecology | Admitting: Obstetrics and Gynecology

## 2018-04-18 ENCOUNTER — Ambulatory Visit (INDEPENDENT_AMBULATORY_CARE_PROVIDER_SITE_OTHER): Payer: 59 | Admitting: Obstetrics and Gynecology

## 2018-04-18 ENCOUNTER — Encounter: Payer: Self-pay | Admitting: Obstetrics and Gynecology

## 2018-04-18 DIAGNOSIS — O0930 Supervision of pregnancy with insufficient antenatal care, unspecified trimester: Secondary | ICD-10-CM | POA: Insufficient documentation

## 2018-04-18 DIAGNOSIS — O0933 Supervision of pregnancy with insufficient antenatal care, third trimester: Secondary | ICD-10-CM

## 2018-04-18 DIAGNOSIS — Z23 Encounter for immunization: Secondary | ICD-10-CM | POA: Diagnosis not present

## 2018-04-18 DIAGNOSIS — Z3483 Encounter for supervision of other normal pregnancy, third trimester: Secondary | ICD-10-CM | POA: Diagnosis not present

## 2018-04-18 DIAGNOSIS — Z3A37 37 weeks gestation of pregnancy: Secondary | ICD-10-CM | POA: Diagnosis not present

## 2018-04-18 DIAGNOSIS — Z3480 Encounter for supervision of other normal pregnancy, unspecified trimester: Secondary | ICD-10-CM | POA: Diagnosis present

## 2018-04-18 DIAGNOSIS — A749 Chlamydial infection, unspecified: Secondary | ICD-10-CM

## 2018-04-18 HISTORY — DX: Chlamydial infection, unspecified: A74.9

## 2018-04-18 LAB — POCT URINALYSIS DIP (DEVICE)
Bilirubin Urine: NEGATIVE
GLUCOSE, UA: NEGATIVE mg/dL
KETONES UR: NEGATIVE mg/dL
Nitrite: NEGATIVE
Protein, ur: 30 mg/dL — AB
SPECIFIC GRAVITY, URINE: 1.025 (ref 1.005–1.030)
UROBILINOGEN UA: 0.2 mg/dL (ref 0.0–1.0)
pH: 7 (ref 5.0–8.0)

## 2018-04-18 NOTE — Patient Instructions (Signed)
Vaginal Delivery Vaginal delivery means that you will give birth by pushing your baby out of your birth canal (vagina). A team of health care providers will help you before, during, and after vaginal delivery. Birth experiences are unique for every woman and every pregnancy, and birth experiences vary depending on where you choose to give birth. What should I do to prepare for my baby's birth? Before your baby is born, it is important to talk with your health care provider about:  Your labor and delivery preferences. These may include: ? Medicines that you may be given. ? How you will manage your pain. This might include non-medical pain relief techniques or injectable pain relief such as epidural analgesia. ? How you and your baby will be monitored during labor and delivery. ? Who may be in the labor and delivery room with you. ? Your feelings about surgical delivery of your baby (cesarean delivery, or C-section) if this becomes necessary. ? Your feelings about receiving donated blood through an IV tube (blood transfusion) if this becomes necessary.  Whether you are able: ? To take pictures or videos of the birth. ? To eat during labor and delivery. ? To move around, walk, or change positions during labor and delivery.  What to expect after your baby is born, such as: ? Whether delayed umbilical cord clamping and cutting is offered. ? Who will care for your baby right after birth. ? Medicines or tests that may be recommended for your baby. ? Whether breastfeeding is supported in your hospital or birth center. ? How long you will be in the hospital or birth center.  How any medical conditions you have may affect your baby or your labor and delivery experience.  To prepare for your baby's birth, you should also:  Attend all of your health care visits before delivery (prenatal visits) as recommended by your health care provider. This is important.  Prepare your home for your baby's  arrival. Make sure that you have: ? Diapers. ? Baby clothing. ? Feeding equipment. ? Safe sleeping arrangements for you and your baby.  Install a car seat in your vehicle. Have your car seat checked by a certified car seat installer to make sure that it is installed safely.  Think about who will help you with your new baby at home for at least the first several weeks after delivery.  What can I expect when I arrive at the birth center or hospital? Once you are in labor and have been admitted into the hospital or birth center, your health care provider may:  Review your pregnancy history and any concerns you have.  Insert an IV tube into one of your veins. This is used to give you fluids and medicines.  Check your blood pressure, pulse, temperature, and heart rate (vital signs).  Check whether your bag of water (amniotic sac) has broken (ruptured).  Talk with you about your birth plan and discuss pain control options.  Monitoring Your health care provider may monitor your contractions (uterine monitoring) and your baby's heart rate (fetal monitoring). You may need to be monitored:  Often, but not continuously (intermittently).  All the time or for long periods at a time (continuously). Continuous monitoring may be needed if: ? You are taking certain medicines, such as medicine to relieve pain or make your contractions stronger. ? You have pregnancy or labor complications.  Monitoring may be done by:  Placing a special stethoscope or a handheld monitoring device on your abdomen to   check your baby's heartbeat, and feeling your abdomen for contractions. This method of monitoring does not continuously record your baby's heartbeat or your contractions.  Placing monitors on your abdomen (external monitors) to record your baby's heartbeat and the frequency and length of contractions. You may not have to wear external monitors all the time.  Placing monitors inside of your uterus  (internal monitors) to record your baby's heartbeat and the frequency, length, and strength of your contractions. ? Your health care provider may use internal monitors if he or she needs more information about the strength of your contractions or your baby's heart rate. ? Internal monitors are put in place by passing a thin, flexible wire through your vagina and into your uterus. Depending on the type of monitor, it may remain in your uterus or on your baby's head until birth. ? Your health care provider will discuss the benefits and risks of internal monitoring with you and will ask for your permission before inserting the monitors.  Telemetry. This is a type of continuous monitoring that can be done with external or internal monitors. Instead of having to stay in bed, you are able to move around during telemetry. Ask your health care provider if telemetry is an option for you.  Physical exam Your health care provider may perform a physical exam. This may include:  Checking whether your baby is positioned: ? With the head toward your vagina (head-down). This is most common. ? With the head toward the top of your uterus (head-up or breech). If your baby is in a breech position, your health care provider may try to turn your baby to a head-down position so you can deliver vaginally. If it does not seem that your baby can be born vaginally, your provider may recommend surgery to deliver your baby. In rare cases, you may be able to deliver vaginally if your baby is head-up (breech delivery). ? Lying sideways (transverse). Babies that are lying sideways cannot be delivered vaginally.  Checking your cervix to determine: ? Whether it is thinning out (effacing). ? Whether it is opening up (dilating). ? How low your baby has moved into your birth canal.  What are the three stages of labor and delivery?  Normal labor and delivery is divided into the following three stages: Stage 1  Stage 1 is the  longest stage of labor, and it can last for hours or days. Stage 1 includes: ? Early labor. This is when contractions may be irregular, or regular and mild. Generally, early labor contractions are more than 10 minutes apart. ? Active labor. This is when contractions get longer, more regular, more frequent, and more intense. ? The transition phase. This is when contractions happen very close together, are very intense, and may last longer than during any other part of labor.  Contractions generally feel mild, infrequent, and irregular at first. They get stronger, more frequent (about every 2-3 minutes), and more regular as you progress from early labor through active labor and transition.  Many women progress through stage 1 naturally, but you may need help to continue making progress. If this happens, your health care provider may talk with you about: ? Rupturing your amniotic sac if it has not ruptured yet. ? Giving you medicine to help make your contractions stronger and more frequent.  Stage 1 ends when your cervix is completely dilated to 4 inches (10 cm) and completely effaced. This happens at the end of the transition phase. Stage 2  Once   your cervix is completely effaced and dilated to 4 inches (10 cm), you may start to feel an urge to push. It is common for the body to naturally take a rest before feeling the urge to push, especially if you received an epidural or certain other pain medicines. This rest period may last for up to 1-2 hours, depending on your unique labor experience.  During stage 2, contractions are generally less painful, because pushing helps relieve contraction pain. Instead of contraction pain, you may feel stretching and burning pain, especially when the widest part of your baby's head passes through the vaginal opening (crowning).  Your health care provider will closely monitor your pushing progress and your baby's progress through the vagina during stage 2.  Your  health care provider may massage the area of skin between your vaginal opening and anus (perineum) or apply warm compresses to your perineum. This helps it stretch as the baby's head starts to crown, which can help prevent perineal tearing. ? In some cases, an incision may be made in your perineum (episiotomy) to allow the baby to pass through the vaginal opening. An episiotomy helps to make the opening of the vagina larger to allow more room for the baby to fit through.  It is very important to breathe and focus so your health care provider can control the delivery of your baby's head. Your health care provider may have you decrease the intensity of your pushing, to help prevent perineal tearing.  After delivery of your baby's head, the shoulders and the rest of the body generally deliver very quickly and without difficulty.  Once your baby is delivered, the umbilical cord may be cut right away, or this may be delayed for 1-2 minutes, depending on your baby's health. This may vary among health care providers, hospitals, and birth centers.  If you and your baby are healthy enough, your baby may be placed on your chest or abdomen to help maintain the baby's temperature and to help you bond with each other. Some mothers and babies start breastfeeding at this time. Your health care team will dry your baby and help keep your baby warm during this time.  Your baby may need immediate care if he or she: ? Showed signs of distress during labor. ? Has a medical condition. ? Was born too early (prematurely). ? Had a bowel movement before birth (meconium). ? Shows signs of difficulty transitioning from being inside the uterus to being outside of the uterus. If you are planning to breastfeed, your health care team will help you begin a feeding. Stage 3  The third stage of labor starts immediately after the birth of your baby and ends after you deliver the placenta. The placenta is an organ that develops  during pregnancy to provide oxygen and nutrients to your baby in the womb.  Delivering the placenta may require some pushing, and you may have mild contractions. Breastfeeding can stimulate contractions to help you deliver the placenta.  After the placenta is delivered, your uterus should tighten (contract) and become firm. This helps to stop bleeding in your uterus. To help your uterus contract and to control bleeding, your health care provider may: ? Give you medicine by injection, through an IV tube, by mouth, or through your rectum (rectally). ? Massage your abdomen or perform a vaginal exam to remove any blood clots that are left in your uterus. ? Empty your bladder by placing a thin, flexible tube (catheter) into your bladder. ? Encourage   you to breastfeed your baby. After labor is over, you and your baby will be monitored closely to ensure that you are both healthy until you are ready to go home. Your health care team will teach you how to care for yourself and your baby. This information is not intended to replace advice given to you by your health care provider. Make sure you discuss any questions you have with your health care provider. Document Released: 03/28/2008 Document Revised: 01/07/2016 Document Reviewed: 07/04/2015 Elsevier Interactive Patient Education  2018 [employer]  

## 2018-04-18 NOTE — BH Specialist Note (Deleted)
Integrated Behavioral Health Initial Visit  MRN: [id number] Name: Lorraine Rogers  Number of Integrated Behavioral Health Clinician visits:: 1/6 Session Start time: ***  Session End time: *** Total time: {IBH Total Time:21014050}  Type of Service: Integrated Behavioral Health- Individual/Family Interpretor:No. Interpretor Name and Language: n/a   Warm Hand Off Completed.       SUBJECTIVE: Lorraine Rogers is a 27 y.o. female accompanied by {CHL AMB ACCOMPANIED WU:9811914782} Patient was referred by Lorraine Elm, MD for Initial OB introduction to integrated behavioral health services . Patient reports the following symptoms/concerns: *** Duration of problem: ***; Severity of problem: {Mild/Moderate/Severe:20260}  OBJECTIVE: Mood: {BHH MOOD:22306} and Affect: {BHH AFFECT:22307} Risk of harm to self or others: {CHL AMB BH Suicide Current Mental Status:21022748}  LIFE CONTEXT: Family and Social: *** School/Work: *** Self-Care: *** Life Changes: Current pregnancy ***  GOALS ADDRESSED: Patient will: 1. Reduce symptoms of: {IBH Symptoms:21014056} 2. Increase knowledge and/or ability of: {IBH Patient Tools:21014057}  3. Demonstrate ability to: {IBH Goals:21014053}  INTERVENTIONS: Interventions utilized: {IBH Interventions:21014054}  Standardized Assessments completed: GAD-7 and PHQ 9  ASSESSMENT: Patient currently experiencing Supervision of *** pregnancy, ***.   Patient may benefit from Initial OB introduction to integrated behavioral health services .  PLAN: 1. Follow up with behavioral health clinician on : *** 2. Behavioral recommendations: *** 3. Referral(s): {IBH Referrals:21014055} 4. "From scale of 1-10, how likely are you to follow plan?": ***  Lorraine Close Dennard Vezina, LCSW  ***
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
## 2018-04-19 LAB — GC/CHLAMYDIA PROBE AMP (~~LOC~~) NOT AT ARMC
CHLAMYDIA, DNA PROBE: POSITIVE — AB
Neisseria Gonorrhea: NEGATIVE

## 2018-04-20 ENCOUNTER — Inpatient Hospital Stay (HOSPITAL_COMMUNITY)
Admission: AD | Admit: 2018-04-20 | Discharge: 2018-04-20 | Disposition: A | Discharge status: Home / Self Care | Payer: 59 | Source: Ambulatory Visit | Attending: Obstetrics and Gynecology | Admitting: Obstetrics and Gynecology

## 2018-04-20 ENCOUNTER — Encounter (HOSPITAL_COMMUNITY): Payer: Self-pay | Admitting: Student

## 2018-04-20 DIAGNOSIS — O98813 Other maternal infectious and parasitic diseases complicating pregnancy, third trimester: Secondary | ICD-10-CM

## 2018-04-20 DIAGNOSIS — O98313 Other infections with a predominantly sexual mode of transmission complicating pregnancy, third trimester: Secondary | ICD-10-CM | POA: Diagnosis present

## 2018-04-20 DIAGNOSIS — O99333 Smoking (tobacco) complicating pregnancy, third trimester: Secondary | ICD-10-CM | POA: Diagnosis not present

## 2018-04-20 DIAGNOSIS — A568 Sexually transmitted chlamydial infection of other sites: Secondary | ICD-10-CM | POA: Diagnosis not present

## 2018-04-20 DIAGNOSIS — Z3A37 37 weeks gestation of pregnancy: Secondary | ICD-10-CM | POA: Diagnosis not present

## 2018-04-20 DIAGNOSIS — F17210 Nicotine dependence, cigarettes, uncomplicated: Secondary | ICD-10-CM | POA: Insufficient documentation

## 2018-04-20 DIAGNOSIS — Z880 Allergy status to penicillin: Secondary | ICD-10-CM | POA: Diagnosis not present

## 2018-04-20 DIAGNOSIS — A749 Chlamydial infection, unspecified: Secondary | ICD-10-CM | POA: Diagnosis not present

## 2018-04-20 HISTORY — DX: Chlamydial infection, unspecified: A74.9

## 2018-04-20 LAB — URINE CULTURE, OB REFLEX

## 2018-04-20 LAB — CULTURE, OB URINE

## 2018-04-20 MED ORDER — AZITHROMYCIN 500 MG PO TABS: 1000.0000 mg | ORAL_TABLET | Freq: Once | ORAL | 0 refills | Status: AC

## 2018-04-20 NOTE — Discharge Instructions (Signed)
Chlamydia, Female Chlamydia is an STD (sexually transmitted disease). It is a bacterial infection that spreads (is contagious) through sexual contact. Chlamydia can occur in different areas of the body, including:  The tube that moves urine from the bladder out of the body (urethra).  The lower part of the uterus (cervix).  The throat.  The rectum.  This condition is not difficult to treat. However, if left untreated, chlamydia can lead to more serious health problems, including pelvic inflammatory disorder (PID). PID can increase your risk of not being able to have children (sterility). What are the causes? Chlamydia is caused by the bacteria Chlamydia trachomatis. It is passed from an infected partner during sexual activity. Chlamydia can spread through contact with the genitals, mouth, or rectum. What are the signs or symptoms? In some cases, there may not be any symptoms for this condition (asymptomatic), especially early in the infection. If symptoms develop, they may include:  Burning with urination.  Frequent urination.  Vaginal discharge.  Redness, soreness, and swelling (inflammation) of the rectum.  Bleeding or discharge from the rectum.  Abdominal pain.  Pain during sexual intercourse.  Bleeding between menstrual periods.  Itching, burning, or redness in the eyes, or discharge from the eyes.  How is this diagnosed? This condition may be diagnosed with:  Urine tests.  Swab tests. Depending on your symptoms, your health care provider may use a cotton swab to collect discharge from your vagina or rectum to test for the bacteria.  A pelvic exam.  How is this treated? This condition is treated with antibiotic medicines. If you are pregnant, certain types of antibiotics will need to be avoided. Follow these instructions at home: Medicines  Take over-the-counter and prescription medicines only as told by your health care provider.  Take your antibiotic medicine  as told by your health care provider. Do not stop taking the antibiotic even if you start to feel better. Sexual activity  Tell sexual partners about your infection. This includes any oral, anal, or vaginal sex partners you have had within 60 days of when your symptoms started. Sexual partners should also be treated, even if they have no signs of the disease.  Do not have sex until you and your sexual partners have completed treatment and your health care provider says it is okay. If your health care provider prescribed you a single dose treatment, wait 7 days after taking the treatment before having sex. General instructions  It is your responsibility to get your test results. Ask your health care provider, or the department performing the test, when your results will be ready.  Get plenty of rest.  Eat a healthy, well-balanced diet.  Drink enough fluids to keep your urine clear or pale yellow.  Keep all follow-up visits as told by your health care provider. This is important. You may need to be tested for infection again 3 months after treatment. How is this prevented? The only sure way to prevent chlamydia is to avoid having sex. However, you can lower your risk by:  Using latex condoms correctly every time you have sex.  Not having multiple sexual partners.  Asking if your sexual partner has been tested for STIs and had negative results.  Contact a health care provider if:  You develop new symptoms or your symptoms do not get better after completing treatment.  You have a fever or chills.  You have pain during sexual intercourse. Get help right away if:  Your pain gets worse and does   not get better with medicine.  You develop flu-like symptoms, such as night sweats, sore throat, or muscle aches.  You experience nausea or vomiting.  You have difficulty swallowing.  You have bleeding between periods or after sex.  You have irregular menstrual periods.  You have  abdominal or lower back pain that does not get better with medicine.  You feel weak or dizzy, or you faint.  You are pregnant and you develop symptoms of chlamydia. Summary  Chlamydia is an STD (sexually transmitted disease). It is a bacterial infection that spreads (is contagious) through sexual contact.  This condition is not difficult to treat, however. If left untreated, chlamydia can lead to more serious health problems, including pelvic inflammatory disease (PID).  In some cases, there may not be any symptoms for this condition (asymptomatic).  This condition is treated with antibiotic medicines.  Using latex condoms correctly every time you have sex can help prevent chlamydia. This information is not intended to replace advice given to you by your health care provider. Make sure you discuss any questions you have with your health care provider. Document Released: 03/29/2005 Document Revised: 06/05/2016 Document Reviewed: 06/05/2016 Elsevier Interactive Patient Education  2018 [employer]vier Inc.  

## 2018-04-20 NOTE — MAU Note (Signed)
Lorraine Rogers is a 27 y.o. at [redacted]w[redacted]d here in MAU reporting: + STD result (04/18/18 positive for chlamydia according to resulted labs in her chart) . States she saw the result in her mychart. Called in last night and was told she had to come in to get a prescription and for the provider to review her information. So she presents today for a prescription. Denies any other complaints.  Vitals:   04/20/18 1217  BP: (!) 123/59  Pulse: 92  Resp: 18  Temp: 98.1 F (36.7 C)  SpO2: 100%   FHT:153 Lab orders placed from triage: none

## 2018-04-20 NOTE — MAU Provider Note (Signed)
Chief Complaint: std treatment   First Provider Initiated Contact with Patient 04/20/18 1223     SUBJECTIVE HPI: Lorraine Rogers is a 27 y.o. [id number] at [redacted]w[redacted]d who presents to [facility] requesting STD treatment. Patient saw through her mychart that she tested positive for chlamydia but hasn't been contacted yet.  Denies abdominal pain or vaginal bleeding. Presents with her partner.    Past Medical History:  Diagnosis Date  . Chlamydia 04/18/2018  . Tonsillitis, chronic   . Trichomoniasis of vagina    OB History  Gravida Para Term Preterm AB Living  4 2 2   1 2   SAB TAB Ectopic Multiple Live Births  1     0 2    # Outcome Date GA Lbr Len/2nd Weight Sex Delivery Anes PTL Lv  4 Current           3 Term 05/26/16 [redacted]w[redacted]d 09:19 / 00:09 3294 g F Vag-Spont EPI  LIV     Birth Comments: WNL  2 SAB 2016 [redacted]w[redacted]d         1 Term 03/24/11 [redacted]w[redacted]d 15:52 / 00:34 3779 g M Vag-Spont EPI  LIV   Past Surgical History:  Procedure Laterality Date  . TONSILLECTOMY N/A 01/19/2014   Procedure: TONSILLECTOMY;  Surgeon: Christia Reading, MD;  Location: [facility];  Service: ENT;  Laterality: N/A;   Social History   Socioeconomic History  . Marital status: Single    Spouse name: Not on file  . Number of children: Not on file  . Years of education: Not on file  . Highest education level: Not on file  Occupational History  . Not on file  Social Needs  . Financial resource strain: Not on file  . Food insecurity:    Worry: Not on file    Inability: Not on file  . Transportation needs:    Medical: Not on file    Non-medical: Not on file  Tobacco Use  . Smoking status: Current Every Day Smoker    Packs/day: 0.25    Types: Cigarettes    Last attempt to quit: 10/21/2015    Years since quitting: 2.4  . Smokeless tobacco: Never Used  Substance and Sexual Activity  . Alcohol use: Not Currently    Alcohol/week: 1.0 standard drinks    Types: 1 Shots of liquor per week    Comment:  social-stopped 10/2015  . Drug use: No    Types: Marijuana    Comment: stopped when found out pregnant 10/2015  . Sexual activity: Not Currently    Birth control/protection: IUD  Lifestyle  . Physical activity:    Days per week: Not on file    Minutes per session: Not on file  . Stress: Not on file  Relationships  . Social connections:    Talks on phone: Not on file    Gets together: Not on file    Attends religious service: Not on file    Active member of club or organization: Not on file    Attends meetings of clubs or organizations: Not on file    Relationship status: Not on file  . Intimate partner violence:    Fear of current or ex partner: Not on file    Emotionally abused: Not on file    Physically abused: Not on file    Forced sexual activity: Not on file  Other Topics Concern  . Not on file  Social History Narrative  . Not on file   Family History  Problem Relation Age of Onset  . Diabetes Mother    No current facility-administered medications on file prior to encounter.    Current Outpatient Medications on File Prior to Encounter  Medication Sig Dispense Refill  . Prenatal Vit w/Fe-Methylfol-FA (PNV PO) Take by mouth.     Allergies  Allergen Reactions  . Penicillins Hives and Other (See Comments)    Has patient had a PCN reaction causing immediate rash, facial/tongue/throat swelling, SOB or lightheadedness with hypotension: Yes Has patient had a PCN reaction causing severe rash involving mucus membranes or skin necrosis: No Has patient had a PCN reaction that required hospitalization No Has patient had a PCN reaction occurring within the last 10 years: Yes If all of the above answers are "NO", then may proceed with Cephalosporin use.  . Sulfa Antibiotics Hives    I have reviewed patient's Past Medical Hx, Surgical Hx, Family Hx, Social Hx, medications and allergies.   Review of Systems  Constitutional: Negative.   Gastrointestinal: Negative.    Genitourinary: Negative.     OBJECTIVE Patient Vitals for the past 24 hrs:  BP Temp Temp src Pulse Resp SpO2 Weight  04/20/18 1217 (!) 123/59 98.1 F (36.7 C) Oral 92 18 100 % 114.8 kg   Constitutional: Well-developed, well-nourished female in no acute distress.  Respiratory: normal rate and effort.  Neurologic: Alert and oriented x 4.  masses.     MAU COURSE Orders Placed This Encounter  Procedures  . Discharge patient   Meds ordered this encounter  Medications  . azithromycin (ZITHROMAX) 500 MG tablet    Sig: Take 2 tablets (1,000 mg total) by mouth once for 1 dose.    Dispense:  2 tablet    Refill:  0    Order Specific Question:   Supervising Provider    Answer:   CONSTANT, PEGGY [[id number]]    MDM FHT 153 by doppler Per review of records, GC/CT resulted yesterday and shows positive chlamydia. Will send in rx for azithromycin & give partner EPT.   ASSESSMENT 1. Chlamydia infection affecting pregnancy in third trimester   2. [redacted] weeks gestation of pregnancy     PLAN Discharge home in stable condition. No IC x 2 weeks  Allergies as of 04/20/2018      Reactions   Penicillins Hives, Other (See Comments)   Has patient had a PCN reaction causing immediate rash, facial/tongue/throat swelling, SOB or lightheadedness with hypotension: Yes Has patient had a PCN reaction causing severe rash involving mucus membranes or skin necrosis: No Has patient had a PCN reaction that required hospitalization No Has patient had a PCN reaction occurring within the last 10 years: Yes If all of the above answers are "NO", then may proceed with Cephalosporin use.   Sulfa Antibiotics Hives      Medication List    TAKE these medications   azithromycin 500 MG tablet Commonly known as:  ZITHROMAX Take 2 tablets (1,000 mg total) by mouth once for 1 dose.   PNV PO Take by mouth.        Judeth Horn, NP 04/20/2018  10:05 PM

## 2018-04-22 ENCOUNTER — Telehealth: Payer: Self-pay | Admitting: *Deleted

## 2018-04-22 DIAGNOSIS — A749 Chlamydial infection, unspecified: Secondary | ICD-10-CM

## 2018-04-22 LAB — CULTURE, BETA STREP (GROUP B ONLY): Strep Gp B Culture: NEGATIVE

## 2018-04-22 MED ORDER — AZITHROMYCIN 250 MG PO TABS: 1000.0000 mg | ORAL_TABLET | Freq: Once | ORAL | 0 refills | Status: AC

## 2018-04-22 NOTE — Telephone Encounter (Signed)
Called pt to inform her of +chlamydia.  Pt did not pick up.  Left voicemail instructing her to call the office back to discuss results.  Order for zithromax sent to the [facility] pharmacy on [address].  STD card completed

## 2018-04-22 NOTE — Telephone Encounter (Signed)
-----   Message from Hermina Staggers, MD sent at 04/22/2018 11:35 AM EDT ----- Macrobid 1 po bid x 7 days for UTI Thanks Casimiro Needle

## 2018-04-22 NOTE — Telephone Encounter (Signed)
-----   Message from Hermina Staggers, MD sent at 04/19/2018 10:14 PM EDT ----- Please let Ms Ruud know she tested + for Chlamydia Azithromycin 2 gm po x 1 Pelvic rest until [facility] Partner needs to be evaluated as well.  Thanks Casimiro Needle

## 2018-04-22 NOTE — Telephone Encounter (Signed)
Kischa called back and left a message she got a call from [place] and was calling back.

## 2018-04-22 NOTE — Telephone Encounter (Addendum)
I called Lorraine Rogers and notifed her of + Chlamydia.  She states she saw it in [place] and came to hospital and they treated her and partner. Will pick up rx for BV .she voices understanding.

## 2018-04-22 NOTE — Telephone Encounter (Signed)
I called Lorraine Rogers and notified her of +UTI and rx sent to pharmacy. She voices understanding.

## 2018-04-23 ENCOUNTER — Encounter: Payer: Self-pay | Admitting: *Deleted

## 2018-04-24 ENCOUNTER — Ambulatory Visit (INDEPENDENT_AMBULATORY_CARE_PROVIDER_SITE_OTHER): Payer: 59 | Admitting: Obstetrics and Gynecology

## 2018-04-24 ENCOUNTER — Encounter: Payer: Self-pay | Admitting: Obstetrics and Gynecology

## 2018-04-24 VITALS — BP 124/64 | HR 75 | Wt 253.8 lb

## 2018-04-24 DIAGNOSIS — A749 Chlamydial infection, unspecified: Secondary | ICD-10-CM

## 2018-04-24 DIAGNOSIS — O368123 Decreased fetal movements, second trimester, fetus 3: Secondary | ICD-10-CM

## 2018-04-24 DIAGNOSIS — O0933 Supervision of pregnancy with insufficient antenatal care, third trimester: Secondary | ICD-10-CM

## 2018-04-24 DIAGNOSIS — Z3480 Encounter for supervision of other normal pregnancy, unspecified trimester: Secondary | ICD-10-CM

## 2018-04-24 DIAGNOSIS — O368120 Decreased fetal movements, second trimester, not applicable or unspecified: Secondary | ICD-10-CM

## 2018-04-24 DIAGNOSIS — O0930 Supervision of pregnancy with insufficient antenatal care, unspecified trimester: Secondary | ICD-10-CM

## 2018-04-24 DIAGNOSIS — N3000 Acute cystitis without hematuria: Secondary | ICD-10-CM

## 2018-04-24 MED ORDER — CEPHALEXIN 500 MG PO CAPS: 500.0000 mg | ORAL_CAPSULE | Freq: Four times a day (QID) | ORAL | 2 refills | Status: DC
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
## 2018-04-26 LAB — OBSTETRIC PANEL, INCLUDING HIV
Antibody Screen: NEGATIVE
Basophils Absolute: 0 10*3/uL (ref 0.0–0.2)
Basos: 0 %
EOS (ABSOLUTE): 0.3 10*3/uL (ref 0.0–0.4)
Eos: 3 %
HIV SCREEN 4TH GENERATION: NONREACTIVE
Hematocrit: 30.7 % — ABNORMAL LOW (ref 34.0–46.6)
Hemoglobin: 10.2 g/dL — ABNORMAL LOW (ref 11.1–15.9)
Hepatitis B Surface Ag: NEGATIVE
IMMATURE GRANULOCYTES: 1 %
Immature Grans (Abs): 0.1 10*3/uL (ref 0.0–0.1)
LYMPHS ABS: 2.5 10*3/uL (ref 0.7–3.1)
Lymphs: 23 %
MCH: 29.4 pg (ref 26.6–33.0)
MCHC: 33.2 g/dL (ref 31.5–35.7)
MCV: 89 fL (ref 79–97)
MONOS ABS: 0.7 10*3/uL (ref 0.1–0.9)
Monocytes: 6 %
NEUTROS PCT: 67 %
Neutrophils Absolute: 7.2 10*3/uL — ABNORMAL HIGH (ref 1.4–7.0)
Platelets: 237 10*3/uL (ref 150–450)
RBC: 3.47 x10E6/uL — AB (ref 3.77–5.28)
RDW: 12.9 % (ref 12.3–15.4)
RPR Ser Ql: NONREACTIVE
Rh Factor: POSITIVE
Rubella Antibodies, IGG: 1.56 index (ref >0.99)
WBC: 10.8 10*3/uL (ref 3.4–10.8)

## 2018-04-26 LAB — HEMOGLOBINOPATHY EVALUATION
Ferritin: 8 ng/mL — ABNORMAL LOW (ref 15–150)
HGB A2 QUANT: 2.2 % (ref 1.8–3.2)
HGB A: 97.8 % (ref 96.4–98.8)
HGB C: 0 %
HGB VARIANT: 0 %
Hgb F Quant: 0 % (ref 0.0–2.0)
Hgb S: 0 %
Hgb Solubility: NEGATIVE

## 2018-04-26 LAB — SMN1 COPY NUMBER ANALYSIS (SMA CARRIER SCREENING)

## 2018-04-26 LAB — CYSTIC FIBROSIS GENE TEST

## 2018-04-26 LAB — GLUCOSE TOLERANCE, 1 HOUR: Glucose, 1Hr PP: 110 mg/dL (ref 65–199)

## 2018-04-27 ENCOUNTER — Encounter (HOSPITAL_COMMUNITY): Payer: Self-pay | Admitting: *Deleted

## 2018-04-27 ENCOUNTER — Inpatient Hospital Stay (HOSPITAL_COMMUNITY)
Admission: AD | Admit: 2018-04-27 | Discharge: 2018-04-27 | Disposition: A | Discharge status: Home / Self Care | Payer: 59 | Source: Ambulatory Visit | Attending: Obstetrics & Gynecology | Admitting: Obstetrics & Gynecology

## 2018-04-27 DIAGNOSIS — Z3A38 38 weeks gestation of pregnancy: Secondary | ICD-10-CM | POA: Diagnosis not present

## 2018-04-27 DIAGNOSIS — O471 False labor at or after 37 completed weeks of gestation: Secondary | ICD-10-CM

## 2018-04-27 DIAGNOSIS — Z3480 Encounter for supervision of other normal pregnancy, unspecified trimester: Secondary | ICD-10-CM

## 2018-04-27 DIAGNOSIS — O479 False labor, unspecified: Secondary | ICD-10-CM

## 2018-04-27 NOTE — Progress Notes (Signed)
I have communicated with Sharen Counter, CNM and reviewed vital signs:  Vitals:   04/27/18 1958 04/27/18 2151  BP: (!) 112/48 119/69  Pulse: 89 78  Resp:    Temp:      Vaginal exam:  Dilation: Fingertip Effacement (%): 40 Cervical Position: Posterior Station: -3 Presentation: Vertex Exam by:: A. Anamae Rochelle, RN,   Also reviewed contraction pattern and that non-stress test is reactive.  It has been documented that patient is contracting irregularly with a cervical exam of 0.5/40/-3 not indicating active labor.  Patient denies any other complaints.  Based on this report provider has given order for discharge.  A discharge order and diagnosis entered by a provider.   Labor discharge instructions reviewed with patient.

## 2018-04-27 NOTE — MAU Note (Signed)
Ctxs since this am that have gotten closer and stronger. Denies LOF or bleeding. 2cm last sve

## 2018-04-27 NOTE — Discharge Instructions (Signed)

## 2018-04-27 NOTE — MAU Provider Note (Signed)
S: Ms. Lorraine Rogers is a 27 y.o. [phone] at [redacted]w[redacted]d  who presents to MAU today for labor evaluation.     Cervical exam by RN:  Dilation: Fingertip Effacement (%): 40 Cervical Position: Posterior Station: -3 Presentation: Vertex Exam by:: AMariel Sleet, RN  Fetal Monitoring: Baseline: 120 Variability: moderate  Accelerations: present Decelerations: none Contractions: irregular  MDM Discussed patient with RN. NST reviewed.   A: [facility]UP at [redacted]w[redacted]d  False labor  P: Discharge home Labor precautions and kick counts included in AVS Patient to follow-up with [facility] as scheduled  Patient may return to MAU as needed or when in labor   Hurshel Party, CNM 04/27/2018 9:56 PM

## 2018-05-05 ENCOUNTER — Inpatient Hospital Stay (HOSPITAL_COMMUNITY)
Admission: AD | Admit: 2018-05-05 | Discharge: 2018-05-05 | Disposition: A | Discharge status: Home / Self Care | Payer: 59 | Source: Ambulatory Visit | Attending: Obstetrics and Gynecology | Admitting: Obstetrics and Gynecology

## 2018-05-05 ENCOUNTER — Encounter (HOSPITAL_COMMUNITY): Payer: Self-pay | Admitting: *Deleted

## 2018-05-05 DIAGNOSIS — R030 Elevated blood-pressure reading, without diagnosis of hypertension: Secondary | ICD-10-CM | POA: Diagnosis present

## 2018-05-05 DIAGNOSIS — O1203 Gestational edema, third trimester: Secondary | ICD-10-CM | POA: Diagnosis not present

## 2018-05-05 DIAGNOSIS — O163 Unspecified maternal hypertension, third trimester: Secondary | ICD-10-CM

## 2018-05-05 DIAGNOSIS — F17210 Nicotine dependence, cigarettes, uncomplicated: Secondary | ICD-10-CM | POA: Insufficient documentation

## 2018-05-05 DIAGNOSIS — Z880 Allergy status to penicillin: Secondary | ICD-10-CM | POA: Insufficient documentation

## 2018-05-05 DIAGNOSIS — O99333 Smoking (tobacco) complicating pregnancy, third trimester: Secondary | ICD-10-CM | POA: Diagnosis not present

## 2018-05-05 DIAGNOSIS — O26893 Other specified pregnancy related conditions, third trimester: Secondary | ICD-10-CM | POA: Diagnosis not present

## 2018-05-05 DIAGNOSIS — Z3A39 39 weeks gestation of pregnancy: Secondary | ICD-10-CM | POA: Insufficient documentation

## 2018-05-05 DIAGNOSIS — M7989 Other specified soft tissue disorders: Secondary | ICD-10-CM

## 2018-05-05 HISTORY — DX: Unspecified maternal hypertension, third trimester: O16.3

## 2018-05-05 LAB — CBC
HCT: 33.3 % — ABNORMAL LOW (ref 36.0–46.0)
HEMOGLOBIN: 10.9 g/dL — AB (ref 12.0–15.0)
MCH: 29.9 pg (ref 26.0–34.0)
MCHC: 32.7 g/dL (ref 30.0–36.0)
MCV: 91.5 fL (ref 80.0–100.0)
PLATELETS: 250 10*3/uL (ref 150–400)
RBC: 3.64 MIL/uL — AB (ref 3.87–5.11)
RDW: 13.8 % (ref 11.5–15.5)
WBC: 11.1 10*3/uL — ABNORMAL HIGH (ref 4.0–10.5)
nRBC: 0 % (ref 0.0–0.2)

## 2018-05-05 LAB — COMPREHENSIVE METABOLIC PANEL
ALK PHOS: 100 U/L (ref 38–126)
ALT: 17 U/L (ref 0–44)
AST: 22 U/L (ref 15–41)
Albumin: 2.9 g/dL — ABNORMAL LOW (ref 3.5–5.0)
Anion gap: 8 (ref 5–15)
BUN: 6 mg/dL (ref 6–20)
CHLORIDE: 105 mmol/L (ref 98–111)
CO2: 21 mmol/L — ABNORMAL LOW (ref 22–32)
CREATININE: 0.58 mg/dL (ref 0.44–1.00)
Calcium: 8.6 mg/dL — ABNORMAL LOW (ref 8.9–10.3)
GFR calc Af Amer: >60 mL/min (ref >60)
Glucose, Bld: 98 mg/dL (ref 70–99)
Potassium: 3.7 mmol/L (ref 3.5–5.1)
SODIUM: 134 mmol/L — AB (ref 135–145)
Total Bilirubin: 0.5 mg/dL (ref 0.3–1.2)
Total Protein: 6.3 g/dL — ABNORMAL LOW (ref 6.5–8.1)

## 2018-05-05 LAB — PROTEIN / CREATININE RATIO, URINE
Creatinine, Urine: 194 mg/dL
Protein Creatinine Ratio: 0.16 mg/mg{Cre} — ABNORMAL HIGH (ref 0.00–0.15)
Total Protein, Urine: 31 mg/dL

## 2018-05-05 NOTE — MAU Provider Note (Signed)
History     CSN: [id number]  Arrival date and time: 05/05/18 1137   First Provider Initiated Contact with Patient 05/05/18 1204      Chief Complaint  Patient presents with  . swelling  . Hypertension   HPI  Ms.  Lorraine Rogers is a 27 y.o. year old [id number] female at [redacted]w[redacted]d weeks gestation who presents to MAU reporting swelling in her hands, an elevated BP of 142/76 and 150/76 at home, H/A x 2 days, and "difficulty getting urine out." She feels the urge to urinate, but can't get urine out. She called the after-hours nurse and was told to come here for evaluation. She reports good (+) FM.   Past Medical History:  Diagnosis Date  . Chlamydia 04/18/2018  . Tonsillitis, chronic   . Trichomoniasis of vagina     Past Surgical History:  Procedure Laterality Date  . TONSILLECTOMY N/A 01/19/2014   Procedure: TONSILLECTOMY;  Surgeon: Christia Reading, MD;  Location: [facility];  Service: ENT;  Laterality: N/A;    Family History  Problem Relation Age of Onset  . Diabetes Mother     Social History   Tobacco Use  . Smoking status: Current Every Day Smoker    Packs/day: 0.25    Types: Cigarettes    Last attempt to quit: 10/21/2015    Years since quitting: 2.5  . Smokeless tobacco: Never Used  Substance Use Topics  . Alcohol use: Not Currently    Alcohol/week: 1.0 standard drinks    Types: 1 Shots of liquor per week    Comment: social-stopped 10/2015  . Drug use: No    Types: Marijuana    Comment: stopped when found out pregnant 10/2015    Allergies:  Allergies  Allergen Reactions  . Penicillins Hives and Other (See Comments)    Has patient had a PCN reaction causing immediate rash, facial/tongue/throat swelling, SOB or lightheadedness with hypotension: Yes Has patient had a PCN reaction causing severe rash involving mucus membranes or skin necrosis: No Has patient had a PCN reaction that required hospitalization No Has patient had a PCN reaction occurring within  the last 10 years: Yes If all of the above answers are "NO", then may proceed with Cephalosporin use.  . Sulfa Antibiotics Hives    Medications Prior to Admission  Medication Sig Dispense Refill Last Dose  . cephALEXin (KEFLEX) 500 MG capsule Take 1 capsule (500 mg total) by mouth 4 (four) times daily. 28 capsule 2   . Prenatal Vit w/Fe-Methylfol-FA (PNV PO) Take by mouth.   Taking    Review of Systems  Constitutional: Negative.   HENT: Negative.   Eyes: Negative.   Respiratory: Negative.   Cardiovascular: Negative.   Gastrointestinal: Negative.   Endocrine: Negative.   Genitourinary: Positive for difficulty urinating ("has the urge, but unable to get any out").  Musculoskeletal: Negative.   Skin:       Swelling in hands  Allergic/Immunologic: Negative.   Neurological: Negative.   Hematological: Negative.   Psychiatric/Behavioral: Negative.    Physical Exam   Patient Vitals for the past 24 hrs:  BP Temp Temp src Pulse Resp SpO2  05/05/18 1403 - - - - 18 100 %  05/05/18 1330 (!) 110/56 - - 70 - -  05/05/18 1315 (!) 115/57 - - 71 - -  05/05/18 1231 110/60 - - 83 - -  05/05/18 1216 110/61 - - 85 - -  05/05/18 1201 117/63 - - 98 - -  05/05/18 1157  108/75 97.9 F (36.6 C) Oral 100 18 100 %    Physical Exam  Nursing note and vitals reviewed. Constitutional: She is oriented to person, place, and time. She appears well-developed and well-nourished.  HENT:  Head: Normocephalic and atraumatic.  Eyes: Pupils are equal, round, and reactive to light.  Neck: Normal range of motion.  Cardiovascular: Normal rate.  Respiratory: Effort normal.  GI: Soft.  Genitourinary:  Genitourinary Comments: pelvic deferred  Musculoskeletal: Normal range of motion.  Neurological: She is alert and oriented to person, place, and time.  Skin: Skin is warm and dry.  Psychiatric: She has a normal mood and affect. Her behavior is normal. Judgment and thought content normal.    MAU Course   Procedures  MDM CBC  CMP P/C Ratio Serial BPs  *Consult with Dr. Alysia Penna @ 1345 - notified of patient's complaints, assessments, lab & NST results, tx plan d/c home, keep scheduled appt with [facility] tomorrow - ok to d/c home, agrees with plan  Results for orders placed or performed during the hospital encounter of 05/05/18 (from the past 24 hour(s))  CBC     Status: Abnormal   Collection Time: 05/05/18 12:34 PM  Result Value Ref Range   WBC 11.1 (H) 4.0 - 10.5 K/uL   RBC 3.64 (L) 3.87 - 5.11 MIL/uL   Hemoglobin 10.9 (L) 12.0 - 15.0 g/dL   HCT 40.1 (L) 02.7 - 25.3 %   MCV 91.5 80.0 - 100.0 fL   MCH 29.9 26.0 - 34.0 pg   MCHC 32.7 30.0 - 36.0 g/dL   RDW 66.4 40.3 - 47.4 %   Platelets 250 150 - 400 K/uL   nRBC 0.0 0.0 - 0.2 %  Comprehensive metabolic panel     Status: Abnormal   Collection Time: 05/05/18 12:34 PM  Result Value Ref Range   Sodium 134 (L) 135 - 145 mmol/L   Potassium 3.7 3.5 - 5.1 mmol/L   Chloride 105 98 - 111 mmol/L   CO2 21 (L) 22 - 32 mmol/L   Glucose, Bld 98 70 - 99 mg/dL   BUN 6 6 - 20 mg/dL   Creatinine, Ser 2.59 0.44 - 1.00 mg/dL   Calcium 8.6 (L) 8.9 - 10.3 mg/dL   Total Protein 6.3 (L) 6.5 - 8.1 g/dL   Albumin 2.9 (L) 3.5 - 5.0 g/dL   AST 22 15 - 41 U/L   ALT 17 0 - 44 U/L   Alkaline Phosphatase 100 38 - 126 U/L   Total Bilirubin 0.5 0.3 - 1.2 mg/dL   GFR calc non Af Amer >60 >60 mL/min   GFR calc Af Amer >60 >60 mL/min   Anion gap 8 5 - 15  Protein / creatinine ratio, urine     Status: Abnormal   Collection Time: 05/05/18 12:55 PM  Result Value Ref Range   Creatinine, Urine 194.00 mg/dL   Total Protein, Urine 31 mg/dL   Protein Creatinine Ratio 0.16 (H) 0.00 - 0.15 mg/mg[Cre]    Assessment and Plan  Swelling of both hands  - Reassurance could be normal for pregnancy  Elevated blood pressure affecting pregnancy in third trimester, antepartum - Normal BPs in MAU  - Reassurance given that PEC labs are normal and BPs do not indicate PEC or gHTN  this time - Information provided on how to take BP, HTN in pregnancy, PEC  - Discharge patient - Keep scheduled appt with [facility] on 11/4 - Patient verbalized an understanding of the plan of care  and agrees.    Raelyn Mora, MSN, CNM 05/05/2018, 12:05 PM

## 2018-05-05 NOTE — Discharge Instructions (Signed)
You have NOT been diagnosed with high blood pressure in pregnancy or  Pre-eclampsia today. Please keep your scheduled OB appointments.

## 2018-05-05 NOTE — MAU Note (Addendum)
Lorraine Rogers is a 27 y.o. at [redacted]w[redacted]d here in MAU reporting: +swelling in hands +elevated blood pressure at home. She called the on call nurse and they told her to come in for evaluation. Onset of complaint: this morning Pain score: denies at this time Denies epigastric pain, headache, or changes in vision. Vitals:   05/05/18 1157  BP: 108/75  Pulse: 100  Resp: 18  Temp: 97.9 F (36.6 C)  SpO2: 100%    +FM Lab orders placed from triage: ua. Patient states she was unable to leave urine sample at this time. RN explained the need for the sample.

## 2018-05-06 ENCOUNTER — Encounter: Payer: Self-pay | Admitting: Advanced Practice Midwife

## 2018-05-06 ENCOUNTER — Ambulatory Visit (INDEPENDENT_AMBULATORY_CARE_PROVIDER_SITE_OTHER): Payer: 59 | Admitting: Advanced Practice Midwife

## 2018-05-06 VITALS — BP 111/65 | HR 80 | Wt 252.3 lb

## 2018-05-06 DIAGNOSIS — Z3483 Encounter for supervision of other normal pregnancy, third trimester: Secondary | ICD-10-CM | POA: Diagnosis not present

## 2018-05-06 DIAGNOSIS — Z3480 Encounter for supervision of other normal pregnancy, unspecified trimester: Secondary | ICD-10-CM
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
## 2018-05-07 ENCOUNTER — Telehealth (HOSPITAL_COMMUNITY): Payer: Self-pay | Admitting: *Deleted

## 2018-05-07 ENCOUNTER — Encounter (HOSPITAL_COMMUNITY): Payer: Self-pay | Admitting: *Deleted

## 2018-05-07 NOTE — Telephone Encounter (Signed)
Preadmission screen  

## 2018-05-08 ENCOUNTER — Encounter: Payer: Self-pay | Admitting: *Deleted

## 2018-05-09 ENCOUNTER — Ambulatory Visit (INDEPENDENT_AMBULATORY_CARE_PROVIDER_SITE_OTHER): Payer: 59 | Admitting: *Deleted

## 2018-05-09 ENCOUNTER — Ambulatory Visit: Payer: Self-pay

## 2018-05-09 VITALS — BP 104/55 | HR 74 | Wt 250.0 lb

## 2018-05-09 DIAGNOSIS — O480 Post-term pregnancy: Secondary | ICD-10-CM

## 2018-05-09 NOTE — Progress Notes (Signed)
Pt informed that the ultrasound is considered a limited OB ultrasound and is not intended to be a complete ultrasound exam.  Patient also informed that the ultrasound is not being completed with the intent of assessing for fetal or placental anomalies or any pelvic abnormalities.  Explained that the purpose of today's ultrasound is to assess for presentation, BPP and amniotic fluid volume.  Patient acknowledges the purpose of the exam and the limitations of the study.   IOL is scheduled on 11/12 @ 0630

## 2018-05-10 ENCOUNTER — Other Ambulatory Visit: Payer: Self-pay

## 2018-05-10 ENCOUNTER — Encounter (HOSPITAL_COMMUNITY): Payer: Self-pay

## 2018-05-10 ENCOUNTER — Inpatient Hospital Stay (HOSPITAL_COMMUNITY)
Admission: AD | Admit: 2018-05-10 | Discharge: 2018-05-11 | DRG: 807 | Disposition: A | Discharge status: Home / Self Care | Payer: 59 | Attending: Obstetrics & Gynecology | Admitting: Obstetrics & Gynecology

## 2018-05-10 DIAGNOSIS — Z880 Allergy status to penicillin: Secondary | ICD-10-CM

## 2018-05-10 DIAGNOSIS — O99210 Obesity complicating pregnancy, unspecified trimester: Secondary | ICD-10-CM | POA: Diagnosis present

## 2018-05-10 DIAGNOSIS — Z3483 Encounter for supervision of other normal pregnancy, third trimester: Secondary | ICD-10-CM | POA: Diagnosis present

## 2018-05-10 DIAGNOSIS — O99334 Smoking (tobacco) complicating childbirth: Secondary | ICD-10-CM | POA: Diagnosis present

## 2018-05-10 DIAGNOSIS — O480 Post-term pregnancy: Secondary | ICD-10-CM | POA: Diagnosis present

## 2018-05-10 DIAGNOSIS — Z3A40 40 weeks gestation of pregnancy: Secondary | ICD-10-CM | POA: Diagnosis not present

## 2018-05-10 DIAGNOSIS — O0930 Supervision of pregnancy with insufficient antenatal care, unspecified trimester: Secondary | ICD-10-CM

## 2018-05-10 DIAGNOSIS — F17210 Nicotine dependence, cigarettes, uncomplicated: Secondary | ICD-10-CM | POA: Diagnosis present

## 2018-05-10 DIAGNOSIS — O99214 Obesity complicating childbirth: Secondary | ICD-10-CM | POA: Diagnosis present

## 2018-05-10 DIAGNOSIS — O98813 Other maternal infectious and parasitic diseases complicating pregnancy, third trimester: Secondary | ICD-10-CM

## 2018-05-10 DIAGNOSIS — O163 Unspecified maternal hypertension, third trimester: Secondary | ICD-10-CM

## 2018-05-10 DIAGNOSIS — A749 Chlamydial infection, unspecified: Secondary | ICD-10-CM | POA: Diagnosis present

## 2018-05-10 DIAGNOSIS — E669 Obesity, unspecified: Secondary | ICD-10-CM | POA: Diagnosis present

## 2018-05-10 DIAGNOSIS — Z3480 Encounter for supervision of other normal pregnancy, unspecified trimester: Secondary | ICD-10-CM

## 2018-05-10 HISTORY — DX: Reserved for concepts with insufficient information to code with codable children: IMO0002

## 2018-05-10 LAB — CBC
HEMATOCRIT: 36.3 % (ref 36.0–46.0)
Hemoglobin: 12 g/dL (ref 12.0–15.0)
MCH: 30.2 pg (ref 26.0–34.0)
MCHC: 33.1 g/dL (ref 30.0–36.0)
MCV: 91.4 fL (ref 80.0–100.0)
NRBC: 0 % (ref 0.0–0.2)
PLATELETS: 250 10*3/uL (ref 150–400)
RBC: 3.97 MIL/uL (ref 3.87–5.11)
RDW: 13.8 % (ref 11.5–15.5)
WBC: 12.9 10*3/uL — AB (ref 4.0–10.5)

## 2018-05-10 LAB — TYPE AND SCREEN
ABO/RH(D): A POS
ANTIBODY SCREEN: NEGATIVE

## 2018-05-10 MED ORDER — OXYCODONE-ACETAMINOPHEN 5-325 MG PO TABS: 1.0000 | ORAL_TABLET | ORAL | Status: DC | PRN

## 2018-05-10 MED ORDER — DIBUCAINE 1 % RE OINT: 1.0000 "application " | TOPICAL_OINTMENT | RECTAL | Status: DC | PRN

## 2018-05-10 MED ORDER — COCONUT OIL OIL: 1.0000 "application " | TOPICAL_OIL | Status: DC | PRN

## 2018-05-10 MED ORDER — TETANUS-DIPHTH-ACELL PERTUSSIS 5-2.5-18.5 LF-MCG/0.5 IM SUSP: 0.5000 mL | Freq: Once | INTRAMUSCULAR | Status: DC

## 2018-05-10 MED ORDER — LACTATED RINGERS IV SOLN: 500.0000 mL | Freq: Once | INTRAVENOUS | Status: DC

## 2018-05-10 MED ORDER — ONDANSETRON HCL 4 MG/2ML IJ SOLN: 4.0000 mg | INTRAMUSCULAR | Status: DC | PRN

## 2018-05-10 MED ORDER — LACTATED RINGERS IV SOLN: 500.0000 mL | INTRAVENOUS | Status: DC | PRN

## 2018-05-10 MED ORDER — PRENATAL MULTIVITAMIN CH
1.0000 | ORAL_TABLET | Freq: Every day | ORAL | Status: DC
  Administered 2018-05-11: 1 via ORAL
  Filled 2018-05-10: qty 1

## 2018-05-10 MED ORDER — ONDANSETRON HCL 4 MG/2ML IJ SOLN: 4.0000 mg | Freq: Four times a day (QID) | INTRAMUSCULAR | Status: DC | PRN

## 2018-05-10 MED ORDER — SENNOSIDES-DOCUSATE SODIUM 8.6-50 MG PO TABS
2.0000 | ORAL_TABLET | ORAL | Status: DC
  Filled 2018-05-10: qty 2

## 2018-05-10 MED ORDER — OXYTOCIN 40 UNITS IN LACTATED RINGERS INFUSION - SIMPLE MED: 2.5000 [IU]/h | INTRAVENOUS | Status: DC

## 2018-05-10 MED ORDER — OXYCODONE HCL 5 MG PO TABS: 5.0000 mg | ORAL_TABLET | ORAL | Status: DC | PRN

## 2018-05-10 MED ORDER — LIDOCAINE HCL (PF) 1 % IJ SOLN
30.0000 mL | INTRAMUSCULAR | Status: DC | PRN
  Filled 2018-05-10: qty 30

## 2018-05-10 MED ORDER — BENZOCAINE-MENTHOL 20-0.5 % EX AERO: 1.0000 "application " | INHALATION_SPRAY | CUTANEOUS | Status: DC | PRN

## 2018-05-10 MED ORDER — ACETAMINOPHEN 325 MG PO TABS: 650.0000 mg | ORAL_TABLET | ORAL | Status: DC | PRN

## 2018-05-10 MED ORDER — PHENYLEPHRINE 40 MCG/ML (10ML) SYRINGE FOR IV PUSH (FOR BLOOD PRESSURE SUPPORT)
80.0000 ug | PREFILLED_SYRINGE | INTRAVENOUS | Status: DC | PRN
  Filled 2018-05-10: qty 5

## 2018-05-10 MED ORDER — OXYTOCIN BOLUS FROM INFUSION
500.0000 mL | Freq: Once | INTRAVENOUS | Status: AC
  Administered 2018-05-10: 500 mL via INTRAVENOUS

## 2018-05-10 MED ORDER — ONDANSETRON HCL 4 MG PO TABS: 4.0000 mg | ORAL_TABLET | ORAL | Status: DC | PRN

## 2018-05-10 MED ORDER — OXYTOCIN 40 UNITS IN LACTATED RINGERS INFUSION - SIMPLE MED: 1.0000 m[IU]/min | INTRAVENOUS | Status: DC

## 2018-05-10 MED ORDER — DIPHENHYDRAMINE HCL 25 MG PO CAPS: 25.0000 mg | ORAL_CAPSULE | Freq: Four times a day (QID) | ORAL | Status: DC | PRN

## 2018-05-10 MED ORDER — TERBUTALINE SULFATE 1 MG/ML IJ SOLN
0.2500 mg | Freq: Once | INTRAMUSCULAR | Status: DC | PRN
  Filled 2018-05-10: qty 1

## 2018-05-10 MED ORDER — WITCH HAZEL-GLYCERIN EX PADS: 1.0000 "application " | MEDICATED_PAD | CUTANEOUS | Status: DC | PRN

## 2018-05-10 MED ORDER — LACTATED RINGERS IV SOLN
500.0000 mL | INTRAVENOUS | Status: DC | PRN
  Administered 2018-05-10: 500 mL via INTRAVENOUS

## 2018-05-10 MED ORDER — FENTANYL CITRATE (PF) 100 MCG/2ML IJ SOLN: 100.0000 ug | INTRAMUSCULAR | Status: DC | PRN

## 2018-05-10 MED ORDER — IBUPROFEN 600 MG PO TABS
600.0000 mg | ORAL_TABLET | Freq: Four times a day (QID) | ORAL | Status: DC
  Administered 2018-05-10 – 2018-05-11 (×4): 600 mg via ORAL
  Filled 2018-05-10 (×4): qty 1

## 2018-05-10 MED ORDER — SOD CITRATE-CITRIC ACID 500-334 MG/5ML PO SOLN: 30.0000 mL | ORAL | Status: DC | PRN

## 2018-05-10 MED ORDER — FENTANYL 2.5 MCG/ML BUPIVACAINE 1/10 % EPIDURAL INFUSION (WH - ANES): 14.0000 mL/h | INTRAMUSCULAR | Status: DC | PRN

## 2018-05-10 MED ORDER — ZOLPIDEM TARTRATE 5 MG PO TABS: 5.0000 mg | ORAL_TABLET | Freq: Every evening | ORAL | Status: DC | PRN

## 2018-05-10 MED ORDER — EPHEDRINE 5 MG/ML INJ
10.0000 mg | INTRAVENOUS | Status: DC | PRN
  Filled 2018-05-10: qty 2

## 2018-05-10 MED ORDER — OXYTOCIN 40 UNITS IN LACTATED RINGERS INFUSION - SIMPLE MED
2.5000 [IU]/h | INTRAVENOUS | Status: DC
  Filled 2018-05-10: qty 1000

## 2018-05-10 MED ORDER — OXYCODONE-ACETAMINOPHEN 5-325 MG PO TABS: 2.0000 | ORAL_TABLET | ORAL | Status: DC | PRN

## 2018-05-10 MED ORDER — LACTATED RINGERS IV SOLN: INTRAVENOUS | Status: DC

## 2018-05-10 MED ORDER — LIDOCAINE HCL (PF) 1 % IJ SOLN: 30.0000 mL | INTRAMUSCULAR | Status: DC | PRN

## 2018-05-10 MED ORDER — SIMETHICONE 80 MG PO CHEW: 80.0000 mg | CHEWABLE_TABLET | ORAL | Status: DC | PRN

## 2018-05-10 MED ORDER — DIPHENHYDRAMINE HCL 50 MG/ML IJ SOLN: 12.5000 mg | INTRAMUSCULAR | Status: DC | PRN

## 2018-05-10 MED ORDER — LACTATED RINGERS IV SOLN
INTRAVENOUS | Status: DC
  Administered 2018-05-10: 10:00:00 via INTRAVENOUS

## 2018-05-10 MED ORDER — OXYTOCIN BOLUS FROM INFUSION: 500.0000 mL | Freq: Once | INTRAVENOUS | Status: DC

## 2018-05-10 NOTE — Discharge Summary (Signed)
Postpartum Discharge Summary   Patient Name: Lorraine Rogers DOB: 06-13-1991 MRN: [id number]  Date of admission: 05/10/2018 Delivering Provider: Cam Hai D   Date of discharge: 05/11/2018  Admitting diagnosis: 40WKS, CTX Intrauterine pregnancy: [redacted]w[redacted]d     Secondary diagnosis:  Active Problems:   Obesity in pregnancy   Supervision of other normal pregnancy, antepartum   Late prenatal care   Post-dates pregnancy   Indication for care in labor and delivery, antepartum   Chlamydia infection affecting pregnancy in third trimester  Additional problems: none     Discharge diagnosis: Term Pregnancy Delivered                                                                                                Post partum procedures:none Augmentation: none Complications: None  Hospital course:  Onset of Labor With Vaginal Delivery     27 y.o. yo [phone] at [redacted]w[redacted]d was admitted in Active Labor on 05/10/2018. Patient had an uncomplicated labor course as follows:  Membrane Rupture Time/Date: 10:50 AM ,05/10/2018   Intrapartum Procedures: Episiotomy: None [1]                                         Lacerations:  None [1]  Patient had a delivery of a Viable infant. 05/10/2018  Information for the patient's newborn:  Keelia, Graybill [[id number]]  Delivery Method: Vag-Spont   Pateint had an uncomplicated postpartum course. Chlamydia TOC collected after delivery on 05/10/18. She is ambulating, tolerating a regular diet, passing flatus, and urinating well. Patient is discharged home in stable condition on 05/11/18.  Magnesium Sulfate recieved: No BMZ received: No  Physical exam  Vitals:   05/10/18 1323 05/10/18 1609 05/10/18 2030 05/11/18 0516  BP: 126/63 124/65 (!) 105/51 114/72  Pulse: (!) 48 (!) 55 62 70  Resp: 17 18 18 18   Temp: 98.1 F (36.7 C) 98 F (36.7 C) 98 F (36.7 C) 98.2 F (36.8 C)  TempSrc: Oral Oral Oral Oral  SpO2: 100%  100% 100%  Weight:      Height:       General:  alert, well-appearing, NAD Lochia: appropriate Uterine Fundus: firm Incision: N/A DVT Evaluation: No significant calf/ankle edema. Labs: Lab Results  Component Value Date   WBC 12.9 (H) 05/10/2018   HGB 12.0 05/10/2018   HCT 36.3 05/10/2018   MCV 91.4 05/10/2018   PLT 250 05/10/2018   CMP Latest Ref Rng & Units 05/05/2018  Glucose 70 - 99 mg/dL 98  BUN 6 - 20 mg/dL 6  Creatinine 7.25 - 3.66 mg/dL 4.40  Sodium 347 - 425 mmol/L 134(L)  Potassium 3.5 - 5.1 mmol/L 3.7  Chloride 98 - 111 mmol/L 105  CO2 22 - 32 mmol/L 21(L)  Calcium 8.9 - 10.3 mg/dL 9.5(G)  Total Protein 6.5 - 8.1 g/dL 6.3(L)  Total Bilirubin 0.3 - 1.2 mg/dL 0.5  Alkaline Phos 38 - 126 U/L 100  AST 15 - 41 U/L 22  ALT 0 - 44 U/L 17  Discharge instruction: per After Visit Summary and "Baby and Me Booklet".  After visit meds:  Allergies as of 05/11/2018      Reactions   Penicillins Hives, Other (See Comments)   Has patient had a PCN reaction causing immediate rash, facial/tongue/throat swelling, SOB or lightheadedness with hypotension: Yes Has patient had a PCN reaction causing severe rash involving mucus membranes or skin necrosis: No Has patient had a PCN reaction that required hospitalization No Has patient had a PCN reaction occurring within the last 10 years: Yes If all of the above answers are "NO", then may proceed with Cephalosporin use.   Sulfa Antibiotics Hives      Medication List    STOP taking these medications   cephALEXin 500 MG capsule Commonly known as:  KEFLEX     TAKE these medications   ibuprofen 600 MG tablet Commonly known as:  ADVIL,MOTRIN Take 1 tablet (600 mg total) by mouth every 6 (six) hours.   PNV PO Take by mouth.      Diet: routine diet  Activity: Advance as tolerated. Pelvic rest for 6 weeks.  Follow up Appt: Future Appointments  Date Time Provider Department Center  06/24/2018  9:35 AM Armando Reichert, CNM [facility]   Follow up Visit: Follow-up  Information    Center for [facility].   Specialty:  Obstetrics and Gynecology Why:  You should receive a call for a postpartum visit. If you do not, please call clinic to get an appointment in 4-6 weeks.  Contact information: [address] [place] [address] [phone]         (no postpartum message sent as pt goes to [facility])  Newborn Data: Live born female  Birth Weight: 7 lb 0.5 oz (3190 g) APGAR: 8, 9  Newborn Delivery   Birth date/time:  05/10/2018 10:53:00 Delivery type:  Vaginal, Spontaneous    Baby Feeding: both breast and bottle feeding Disposition:home with mother  05/11/2018 Tamera Stands, DO

## 2018-05-10 NOTE — Progress Notes (Signed)
Still waiting on cbc to result so patient can get epidural

## 2018-05-10 NOTE — H&P (Signed)
Lorraine Rogers is a 27 y.o. female [phone] @ 40.3wks presenting for reg ctx. Denies leaking or bright red vag bldg. Denies H/A, N/V or visual disturbances. Her preg has been followed by the [facility] and has been remarkable for 1) onset of care at 37wks (4 visits total, risking out of SW consult)  2) +chlam on 10/17- tx- need TOC 3) GBS neg  OB History    Gravida  4   Para  2   Term  2   Preterm      AB  1   Living  2     SAB  1   TAB      Ectopic      Multiple  0   Live Births  2          Past Medical History:  Diagnosis Date  . Chlamydia 04/18/2018  . Medical history non-contributory   . Tonsillitis, chronic   . Trichomoniasis of vagina    Past Surgical History:  Procedure Laterality Date  . TONSILLECTOMY N/A 01/19/2014   Procedure: TONSILLECTOMY;  Surgeon: Christia Reading, MD;  Location: [facility];  Service: ENT;  Laterality: N/A;   Family History: family history includes Diabetes in her mother; Hypertension in her mother. Social History:  reports that she has been smoking cigarettes. She has been smoking about 0.25 packs per day. She has never used smokeless tobacco. She reports that she drank about 1.0 standard drinks of alcohol per week. She reports that she does not use drugs.     Maternal Diabetes: No Genetic Screening: Declined Maternal Ultrasounds/Referrals: Normal Fetal Ultrasounds or other Referrals:  None Maternal Substance Abuse:  Yes:  Type: Smoker, Other: EtOH Significant Maternal Medications:  None Significant Maternal Lab Results:  Lab values include: Group B Strep negative, Other: +chlam 04/18/18- tx on 04/20/18 (TOC pending) Other Comments:  onset of care at 37wks  ROS History Dilation: 7 Effacement (%): 100 Station: -2 Exam by:: jolynn Blood pressure (!) 103/51, pulse 73, temperature 97.6 F (36.4 C), temperature source Oral, resp. rate 20, height 5\' 7"  (1.702 m), weight 115.2 kg, unknown if currently  breastfeeding. Exam Physical Exam  Constitutional: She is oriented to person, place, and time. She appears well-developed.  HENT:  Head: Normocephalic.  Neck: Normal range of motion.  Cardiovascular: Normal rate.  Respiratory: Effort normal.  GI:  EFM 120s, +accels, no decels, Cat 1 Ctx q 3-5 mins  Musculoskeletal: Normal range of motion.  Neurological: She is alert and oriented to person, place, and time.  Skin: Skin is warm and dry.  Psychiatric: She has a normal mood and affect. Her behavior is normal. Thought content normal.    Prenatal labs: ABO, Rh: A/Positive/-- (10/17 1143) Antibody: Negative (10/17 1143) Rubella: 1.56 (10/17 1143) RPR: Non Reactive (10/17 1143)  HBsAg: Negative (10/17 1143)  HIV: Non Reactive (10/17 1143)  GBS:   neg 04/18/18  Assessment/Plan: IUP@40 .3wks Active labor GBS neg  Admit to [facility] Expectant management Collect GC/chlam TOC via urine Plans epidural Anticipate SVD   Ezeriah Luty CNM 05/10/2018, 10:32 AM

## 2018-05-10 NOTE — Progress Notes (Signed)
Phoned lab at 1043. Stated currently running cbc.  This nurse requested to be notified when complete

## 2018-05-10 NOTE — MAU Note (Signed)
Brought in WC to rm.  Pt not in good control with ctx's.  Will breath with encouragement.  Ctx's started ealy this morning. 3rd preg,  2 prior vag del, no complications

## 2018-05-11 LAB — RPR: RPR: NONREACTIVE

## 2018-05-11 MED ORDER — IBUPROFEN 600 MG PO TABS: 600.0000 mg | ORAL_TABLET | Freq: Four times a day (QID) | ORAL | 0 refills | Status: DC
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
## 2018-05-14 ENCOUNTER — Inpatient Hospital Stay (HOSPITAL_COMMUNITY): Admission: RE | Admit: 2018-05-14 | Payer: 59 | Source: Ambulatory Visit

## 2018-05-27 ENCOUNTER — Telehealth: Payer: Self-pay | Admitting: Family Medicine

## 2018-05-27 NOTE — Telephone Encounter (Signed)
Patient is requesting a note to go back to work sooner then her appt

## 2018-05-28 NOTE — Telephone Encounter (Signed)
LM for pt that the letter she is requesting she can go to the front office and request the letter and a MyChart will be sent.

## 2018-05-29 ENCOUNTER — Encounter: Payer: Self-pay | Admitting: Family Medicine

## 2018-06-24 ENCOUNTER — Ambulatory Visit: Payer: 59 | Admitting: Advanced Practice Midwife

## 2018-06-27 ENCOUNTER — Other Ambulatory Visit (HOSPITAL_COMMUNITY)
Admission: RE | Admit: 2018-06-27 | Discharge: 2018-06-27 | Disposition: A | Discharge status: Home / Self Care | Payer: 59 | Source: Ambulatory Visit | Attending: Obstetrics and Gynecology | Admitting: Obstetrics and Gynecology

## 2018-06-27 ENCOUNTER — Ambulatory Visit (INDEPENDENT_AMBULATORY_CARE_PROVIDER_SITE_OTHER): Payer: 59 | Admitting: Obstetrics and Gynecology

## 2018-06-27 ENCOUNTER — Encounter: Payer: Self-pay | Admitting: Obstetrics and Gynecology

## 2018-06-27 VITALS — BP 121/57 | HR 57 | Ht 67.0 in | Wt 227.6 lb

## 2018-06-27 DIAGNOSIS — A749 Chlamydial infection, unspecified: Secondary | ICD-10-CM | POA: Diagnosis present

## 2018-06-27 DIAGNOSIS — O98813 Other maternal infectious and parasitic diseases complicating pregnancy, third trimester: Secondary | ICD-10-CM

## 2018-06-27 DIAGNOSIS — Z30017 Encounter for initial prescription of implantable subdermal contraceptive: Secondary | ICD-10-CM

## 2018-06-27 DIAGNOSIS — Z308 Encounter for other contraceptive management: Secondary | ICD-10-CM

## 2018-06-27 LAB — POCT PREGNANCY, URINE: PREG TEST UR: NEGATIVE

## 2018-06-27 MED ORDER — ETONOGESTREL 68 MG ~~LOC~~ IMPL
68.0000 mg | DRUG_IMPLANT | Freq: Once | SUBCUTANEOUS | Status: AC
  Administered 2018-06-27: 68 mg via SUBCUTANEOUS

## 2018-06-27 NOTE — Progress Notes (Signed)
cObstetrics and Gynecology Postpartum Visit  Appointment Date: 06/27/2018  OB[facility] Clinic[facility]  Primary Care Provider: Patient, No Pcp Per  Chief Complaint:  Chief Complaint  Patient presents with  . Postpartum Care    History of Present Illness: Denton LankDecoya Hagedorn is a 27 y.o. African-American [id number] (LMP: 12/26), seen for the above chief complaint. Her past medical history is significant for nothing   She is s/p 05/10/18 SVD/intact perineum; she was discharged to home on PPD#1  Vaginal bleeding or discharge: feels like period started today Breast or formula feeding: formula Intercourse: Yes , Monday with condoms. No pain, issues.  Contraception after delivery: condoms PP depression s/s: No  Any bowel or bladder issues: No  Pap smear: no abnormalities (date: 2017)  Patient already back to work as CNA and no problems or issues there.   Review of Systems: as noted in the History of Present Illness.  Patient Active Problem List   Diagnosis Date Noted  . Post-dates pregnancy 05/10/2018  . Indication for care in labor and delivery, antepartum 05/10/2018  . Chlamydia infection affecting pregnancy in third trimester 05/10/2018  . Elevated blood pressure affecting pregnancy in third trimester, antepartum 05/05/2018  . Supervision of other normal pregnancy, antepartum 04/18/2018  . Late prenatal care 04/18/2018  . Obesity in pregnancy 03/20/2016    Medications None  Allergies Penicillins and Sulfa antibiotics  Physical Exam:  BP (!) 121/57   Pulse (!) 57   Ht 5\' 7"  (1.702 m)   Wt 227 lb 9.6 oz (103.2 kg)   Breastfeeding No   BMI 35.65 kg/m  Body mass index is 35.65 kg/m. General appearance: Well nourished, well developed female in no acute distress.  Cardiovascular: normal s1 and s2.  No murmurs, rubs or gallops. Respiratory:  Clear to auscultation bilateral. Normal respiratory effort Abdomen: positive bowel sounds and no masses, hernias;  diffusely non tender to palpation, non distended Neuro/Psych:  Normal mood and affect.  Skin:  Warm and dry.  Lymphatic:  No inguinal lymphadenopathy.   Pelvic exam:  EGBUS: within normal limits  Laboratory: UPT negative  PP Depression Screening:  EPDS score zero  Assessment: pt doing well  Plan:  D/w her re: normal potential AUB with nexplanon and other options. R/b/a d/w her and patient would like nexplanon. Nexplanon placed today STI swab done today for TOC Pap at next visit   RTC 960m for annual exam  Cornelia Copaharlie Kazuko Clemence, Jr MD Attending [facility](Faculty Practice)

## 2018-06-28 ENCOUNTER — Encounter: Payer: Self-pay | Admitting: Obstetrics and Gynecology

## 2018-06-28 LAB — CERVICOVAGINAL ANCILLARY ONLY
Bacterial vaginitis: POSITIVE — AB
CHLAMYDIA, DNA PROBE: NEGATIVE
Candida vaginitis: NEGATIVE
Neisseria Gonorrhea: NEGATIVE
Trichomonas: POSITIVE — AB

## 2018-06-28 NOTE — Procedures (Signed)
Nexplanon Insertion Procedure Note Prior to the procedure being performed, the patient (or guardian) was asked to state their full name, date of birth, type of procedure being performed and the exact location of the operative site. This information was then checked against the documentation in the patient's chart. Prior to the procedure being performed, a "time out" was performed by the physician that confirmed the correct patient, procedure and site. LMP: 06/27/2018  After informed consent was obtained, the patient's non-dominant left arm was chosen for insertion. A site was marked approximately 8 cm proximal to the medial epicondyle in the sulcus between the biceps and triceps on the inner surface. The area was cleaned with alcohol then local anesthesia was infiltrated with 3 ml of 1% lidocaine with epinephrine along the planned insertion track. The area was prepped with betadine. Using sterile technique the Nexplanon device was inserted per manufacturer's guidelines in the subdermal connective tissue using the standard insertion technique without difficulty. Pressure was applied and the insertion site was hemostatic. The presence of the Nexplanon was confirmed immediately after insertion by palpation by both me and the patient and by checking the tip of needle for the absence of the insert.  A pressure dressing was applied.  The patient tolerated the procedure well.  Cornelia Copaharlie Rhyse Loux, Jr MD Attending [facility](Faculty Practice)

## 2018-07-01 ENCOUNTER — Encounter: Payer: Self-pay | Admitting: Obstetrics and Gynecology

## 2018-07-01 ENCOUNTER — Telehealth: Payer: Self-pay | Admitting: *Deleted

## 2018-07-01 DIAGNOSIS — A599 Trichomoniasis, unspecified: Secondary | ICD-10-CM | POA: Insufficient documentation

## 2018-07-01 HISTORY — DX: Trichomoniasis, unspecified: A59.9

## 2018-07-01 MED ORDER — METRONIDAZOLE 500 MG PO TABS: 500.0000 mg | ORAL_TABLET | Freq: Two times a day (BID) | ORAL | 0 refills | Status: DC

## 2018-07-01 NOTE — Telephone Encounter (Signed)
-----   Message from Algona Bingharlie Pickens, MD sent at 07/01/2018 10:12 AM EST ----- Can you let her know and that I sent in flagy for a week for her? thanks
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
## 2018-07-01 NOTE — Addendum Note (Signed)
Addended by: Belzoni BingPICKENS, Tymeshia Awan on: 07/01/2018 10:13 AM   Modules accepted: Orders

## 2018-10-31 ENCOUNTER — Encounter

## 2019-04-30 ENCOUNTER — Ambulatory Visit (INDEPENDENT_AMBULATORY_CARE_PROVIDER_SITE_OTHER): Payer: 59

## 2019-04-30 ENCOUNTER — Ambulatory Visit (HOSPITAL_COMMUNITY)
Admission: EM | Admit: 2019-04-30 | Discharge: 2019-04-30 | Disposition: A | Discharge status: Home / Self Care | Payer: 59 | Attending: Family Medicine | Admitting: Family Medicine

## 2019-04-30 ENCOUNTER — Encounter (HOSPITAL_COMMUNITY): Payer: Self-pay

## 2019-04-30 ENCOUNTER — Other Ambulatory Visit: Payer: Self-pay

## 2019-04-30 DIAGNOSIS — M25511 Pain in right shoulder: Secondary | ICD-10-CM

## 2019-04-30 DIAGNOSIS — W182XXA Fall in (into) shower or empty bathtub, initial encounter: Secondary | ICD-10-CM | POA: Diagnosis not present

## 2019-04-30 MED ORDER — DICLOFENAC SODIUM 75 MG PO TBEC: 75.0000 mg | DELAYED_RELEASE_TABLET | Freq: Two times a day (BID) | ORAL | 0 refills | Status: DC
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
## 2019-04-30 NOTE — ED Triage Notes (Signed)
Patient presents to Urgent Care with complaints of right shoulder pain since 2 days ago when she woke up. Patient reports when she got up this morning it was still in pain and it was difficult for her to get dressed, will need work note, no known injury.

## 2020-03-09 ENCOUNTER — Encounter (HOSPITAL_COMMUNITY): Payer: Self-pay

## 2020-03-09 ENCOUNTER — Emergency Department (HOSPITAL_COMMUNITY)
Admission: EM | Admit: 2020-03-09 | Discharge: 2020-03-09 | Disposition: A | Discharge status: Home / Self Care | Payer: Medicaid Other | Attending: Emergency Medicine | Admitting: Emergency Medicine

## 2020-03-09 DIAGNOSIS — K0889 Other specified disorders of teeth and supporting structures: Secondary | ICD-10-CM | POA: Insufficient documentation

## 2020-03-09 DIAGNOSIS — R519 Headache, unspecified: Secondary | ICD-10-CM | POA: Insufficient documentation

## 2020-03-09 DIAGNOSIS — Z5321 Procedure and treatment not carried out due to patient leaving prior to being seen by health care provider: Secondary | ICD-10-CM | POA: Insufficient documentation

## 2020-03-11 ENCOUNTER — Emergency Department (HOSPITAL_COMMUNITY): Payer: Self-pay

## 2020-03-11 ENCOUNTER — Encounter (HOSPITAL_COMMUNITY): Payer: Self-pay | Admitting: Emergency Medicine

## 2020-03-11 ENCOUNTER — Emergency Department (HOSPITAL_COMMUNITY)
Admission: EM | Admit: 2020-03-11 | Discharge: 2020-03-11 | Disposition: A | Discharge status: Home / Self Care | Payer: Self-pay | Attending: Emergency Medicine | Admitting: Emergency Medicine

## 2020-03-11 DIAGNOSIS — Y929 Unspecified place or not applicable: Secondary | ICD-10-CM | POA: Insufficient documentation

## 2020-03-11 DIAGNOSIS — T148XXA Other injury of unspecified body region, initial encounter: Secondary | ICD-10-CM

## 2020-03-11 DIAGNOSIS — Y999 Unspecified external cause status: Secondary | ICD-10-CM | POA: Insufficient documentation

## 2020-03-11 DIAGNOSIS — S90811A Abrasion, right foot, initial encounter: Secondary | ICD-10-CM | POA: Insufficient documentation

## 2020-03-11 DIAGNOSIS — R52 Pain, unspecified: Secondary | ICD-10-CM

## 2020-03-11 DIAGNOSIS — M79671 Pain in right foot: Secondary | ICD-10-CM

## 2020-03-11 DIAGNOSIS — W228XXA Striking against or struck by other objects, initial encounter: Secondary | ICD-10-CM | POA: Insufficient documentation

## 2020-03-11 DIAGNOSIS — F17210 Nicotine dependence, cigarettes, uncomplicated: Secondary | ICD-10-CM | POA: Insufficient documentation

## 2020-03-11 DIAGNOSIS — M25522 Pain in left elbow: Secondary | ICD-10-CM

## 2020-03-11 DIAGNOSIS — S50312A Abrasion of left elbow, initial encounter: Secondary | ICD-10-CM | POA: Insufficient documentation

## 2020-03-11 DIAGNOSIS — Y939 Activity, unspecified: Secondary | ICD-10-CM | POA: Insufficient documentation

## 2020-03-11 DIAGNOSIS — M25512 Pain in left shoulder: Secondary | ICD-10-CM | POA: Insufficient documentation

## 2020-03-11 LAB — POC URINE PREG, ED: Preg Test, Ur: NEGATIVE

## 2020-03-11 MED ORDER — ACETAMINOPHEN 325 MG PO TABS
650.0000 mg | ORAL_TABLET | Freq: Once | ORAL | Status: AC
  Administered 2020-03-11: 650 mg via ORAL
  Filled 2020-03-11: qty 2

## 2020-03-11 NOTE — Progress Notes (Signed)
Orthopedic Tech Progress Note Patient Details:  Lorraine Rogers 07-May-1991 [id number]  Ortho Devices Type of Ortho Device: CAM walker Ortho Device/Splint Location: RLE Ortho Device/Splint Interventions: Ordered, Application   Post Interventions Patient Tolerated: Well Instructions Provided: Care of device   Lorraine Rogers 03/11/2020, 7:08 PM
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
## 2020-03-11 NOTE — Discharge Instructions (Addendum)
At this time there does not appear to be the presence of an emergent medical condition, however there is always the potential for conditions to change. Please read and follow the below instructions.  Please return to the Emergency Department immediately for any new or worsening symptoms. Please be sure to follow up with your Primary Care Provider within one week regarding your visit today; please call their office to schedule an appointment even if you are feeling better for a follow-up visit. Please call the orthopedic specialist Dr. Dion Saucier on your discharge paperwork to schedule a follow-up appointment.  As we discussed your right ankle appears to show an old fracture along your medial malleolus.  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  This may need follow-up imaging with CT scan or MRI.  Additionally as to your other areas of pain, no broken bones or other injuries were seen however if symptoms do not improve you may need follow-up imaging or testing.  Please use rest ice and elevation to help with your pain.  Get help right away if: You have: Loss of feeling (numbness), tingling, or weakness in your arms or legs. Very bad neck pain, especially tenderness in the middle of the back of your neck. A change in your ability to control your pee or poop (stool). More pain in any area of your body. Swelling in any area of your body, especially your legs. Shortness of breath or light-headedness. Chest pain. Blood in your pee, poop, or vomit. Very bad pain in your belly (abdomen) or your back. Very bad headaches or headaches that are getting worse. Sudden vision loss or double vision. Your eye suddenly turns red. The black center of your eye (pupil) is an odd shape or size. You have any new/concerning or worsening of symptoms  Please read the additional information packets attached to your discharge summary.  Do not take your medicine if  develop an itchy rash, swelling  in your mouth or lips, or difficulty breathing; call 911 and seek immediate emergency medical attention if this occurs.  You may review your lab tests and imaging results in their entirety on your MyChart account.  Please discuss all results of fully with your primary care provider and other specialist at your follow-up visit.  Note: Portions of this text may have been transcribed using voice recognition software. Every effort was made to ensure accuracy; however, inadvertent computerized transcription errors may still be present.
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
## 2021-11-09 ENCOUNTER — Ambulatory Visit: Payer: Medicaid Other | Admitting: Obstetrics & Gynecology

## 2021-11-17 ENCOUNTER — Ambulatory Visit: Payer: Medicaid Other | Admitting: Obstetrics and Gynecology

## 2021-12-05 ENCOUNTER — Encounter: Payer: Self-pay | Admitting: Obstetrics & Gynecology

## 2021-12-05 ENCOUNTER — Ambulatory Visit (INDEPENDENT_AMBULATORY_CARE_PROVIDER_SITE_OTHER): Payer: Self-pay | Admitting: Obstetrics & Gynecology

## 2021-12-05 VITALS — BP 121/77 | HR 47 | Wt 258.0 lb

## 2021-12-05 DIAGNOSIS — Z3046 Encounter for surveillance of implantable subdermal contraceptive: Secondary | ICD-10-CM

## 2021-12-05 MED ORDER — NORGESTIMATE-ETH ESTRADIOL 0.25-35 MG-MCG PO TABS: 1.0000 | ORAL_TABLET | Freq: Every day | ORAL | 11 refills | Status: DC

## 2021-12-05 NOTE — Progress Notes (Signed)
GYNECOLOGY OFFICE PROCEDURE NOTE  Lorraine Rogers is a 31 y.o. [phone] here for Nexplanon removal.  Last pap smear was on 2017 and was normal.  No other gynecologic concerns.    Nexplanon Removal Patient identified, informed consent performed, consent signed.   Appropriate time out taken. Nexplanon site identified.  Area prepped in usual sterile fashon. One ml of 1% lidocaine was used to anesthetize the area at the distal end of the implant. A small stab incision was made right beside the implant on the distal portion.  The Nexplanon rod was grasped using hemostats and removed without difficulty.  There was minimal blood loss. There were no complications.  3 ml of 1% lidocaine was injected around the incision for post-procedure analgesia.  Steri-strips were applied over the small incision.  A pressure bandage was applied to reduce any bruising.  The patient tolerated the procedure well and was given post procedure instructions.  Patient is planning to use OCP for contraception.     Adam Phenix, MD Attending Obstetrician & Gynecologist, [facility]  12/05/2021

## 2021-12-05 NOTE — Progress Notes (Signed)
Patient here for Nexplanon removal. She stated that she would like BC pills for contraception.  I informed pt that she is due for a pap smear and offered completing it today, pt declined stating "I'll come back"

## 2022-04-17 ENCOUNTER — Telehealth: Payer: Self-pay | Admitting: *Deleted

## 2022-04-17 ENCOUNTER — Encounter (HOSPITAL_COMMUNITY): Payer: Self-pay | Admitting: Student

## 2022-04-17 ENCOUNTER — Other Ambulatory Visit: Payer: Self-pay

## 2022-04-17 ENCOUNTER — Inpatient Hospital Stay (HOSPITAL_COMMUNITY)
Admission: AD | Admit: 2022-04-17 | Discharge: 2022-04-21 | DRG: 885 | Disposition: A | Discharge status: Home / Self Care | Payer: Federal, State, Local not specified - Other | Source: Intra-hospital | Attending: Psychiatry | Admitting: Psychiatry

## 2022-04-17 ENCOUNTER — Ambulatory Visit (HOSPITAL_COMMUNITY)
Admission: EM | Admit: 2022-04-17 | Discharge: 2022-04-17 | Disposition: A | Discharge status: Other Acute Inpatient Hospital | Payer: No Payment, Other | Attending: Psychiatry | Admitting: Psychiatry

## 2022-04-17 DIAGNOSIS — Z833 Family history of diabetes mellitus: Secondary | ICD-10-CM | POA: Diagnosis not present

## 2022-04-17 DIAGNOSIS — Z3202 Encounter for pregnancy test, result negative: Secondary | ICD-10-CM

## 2022-04-17 DIAGNOSIS — F1290 Cannabis use, unspecified, uncomplicated: Secondary | ICD-10-CM | POA: Diagnosis present

## 2022-04-17 DIAGNOSIS — Z9151 Personal history of suicidal behavior: Secondary | ICD-10-CM

## 2022-04-17 DIAGNOSIS — Z1152 Encounter for screening for COVID-19: Secondary | ICD-10-CM | POA: Insufficient documentation

## 2022-04-17 DIAGNOSIS — F410 Panic disorder [episodic paroxysmal anxiety] without agoraphobia: Secondary | ICD-10-CM | POA: Diagnosis present

## 2022-04-17 DIAGNOSIS — F17210 Nicotine dependence, cigarettes, uncomplicated: Secondary | ICD-10-CM | POA: Diagnosis present

## 2022-04-17 DIAGNOSIS — F411 Generalized anxiety disorder: Secondary | ICD-10-CM | POA: Diagnosis present

## 2022-04-17 DIAGNOSIS — Z79899 Other long term (current) drug therapy: Secondary | ICD-10-CM

## 2022-04-17 DIAGNOSIS — Z91410 Personal history of adult physical and sexual abuse: Secondary | ICD-10-CM

## 2022-04-17 DIAGNOSIS — G4700 Insomnia, unspecified: Secondary | ICD-10-CM | POA: Diagnosis present

## 2022-04-17 DIAGNOSIS — F22 Delusional disorders: Secondary | ICD-10-CM | POA: Diagnosis present

## 2022-04-17 DIAGNOSIS — G4710 Hypersomnia, unspecified: Secondary | ICD-10-CM | POA: Diagnosis present

## 2022-04-17 DIAGNOSIS — F322 Major depressive disorder, single episode, severe without psychotic features: Secondary | ICD-10-CM | POA: Diagnosis present

## 2022-04-17 DIAGNOSIS — F603 Borderline personality disorder: Secondary | ICD-10-CM | POA: Diagnosis present

## 2022-04-17 DIAGNOSIS — R45851 Suicidal ideations: Secondary | ICD-10-CM | POA: Diagnosis not present

## 2022-04-17 DIAGNOSIS — Z882 Allergy status to sulfonamides status: Secondary | ICD-10-CM

## 2022-04-17 DIAGNOSIS — Z8249 Family history of ischemic heart disease and other diseases of the circulatory system: Secondary | ICD-10-CM | POA: Diagnosis not present

## 2022-04-17 DIAGNOSIS — F332 Major depressive disorder, recurrent severe without psychotic features: Secondary | ICD-10-CM | POA: Diagnosis not present

## 2022-04-17 DIAGNOSIS — F4010 Social phobia, unspecified: Secondary | ICD-10-CM | POA: Diagnosis present

## 2022-04-17 DIAGNOSIS — Z880 Allergy status to penicillin: Secondary | ICD-10-CM | POA: Diagnosis not present

## 2022-04-17 DIAGNOSIS — F323 Major depressive disorder, single episode, severe with psychotic features: Secondary | ICD-10-CM

## 2022-04-17 HISTORY — DX: Major depressive disorder, single episode, severe without psychotic features: F32.2

## 2022-04-17 HISTORY — DX: Borderline personality disorder: F60.3

## 2022-04-17 LAB — POCT URINE DRUG SCREEN - MANUAL ENTRY (I-SCREEN)
POC Amphetamine UR: NOT DETECTED
POC Buprenorphine (BUP): NOT DETECTED
POC Cocaine UR: NOT DETECTED
POC Marijuana UR: NOT DETECTED
POC Methadone UR: NOT DETECTED
POC Methamphetamine UR: NOT DETECTED
POC Morphine: NOT DETECTED
POC Oxazepam (BZO): NOT DETECTED
POC Oxycodone UR: NOT DETECTED
POC Secobarbital (BAR): NOT DETECTED

## 2022-04-17 LAB — RESP PANEL BY RT-PCR (FLU A&B, COVID) ARPGX2
Influenza A by PCR: NEGATIVE
Influenza B by PCR: NEGATIVE
SARS Coronavirus 2 by RT PCR: NEGATIVE

## 2022-04-17 LAB — TSH: TSH: 2.471 u[IU]/mL (ref 0.350–4.500)

## 2022-04-17 LAB — POCT PREGNANCY, URINE: Preg Test, Ur: NEGATIVE

## 2022-04-17 LAB — POC SARS CORONAVIRUS 2 AG: SARSCOV2ONAVIRUS 2 AG: NEGATIVE

## 2022-04-17 MED ORDER — MELATONIN 5 MG PO TABS
5.0000 mg | ORAL_TABLET | Freq: Every evening | ORAL | Status: DC | PRN
  Administered 2022-04-17 – 2022-04-20 (×4): 5 mg via ORAL
  Filled 2022-04-17 (×4): qty 1

## 2022-04-17 MED ORDER — HYDROXYZINE HCL 25 MG PO TABS: 25.0000 mg | ORAL_TABLET | Freq: Three times a day (TID) | ORAL | Status: DC | PRN

## 2022-04-17 MED ORDER — MAGNESIUM HYDROXIDE 400 MG/5ML PO SUSP: 30.0000 mL | Freq: Every day | ORAL | Status: DC | PRN

## 2022-04-17 MED ORDER — NICOTINE POLACRILEX 2 MG MT GUM: 4.0000 mg | CHEWING_GUM | OROMUCOSAL | Status: DC | PRN

## 2022-04-17 MED ORDER — TRAZODONE HCL 50 MG PO TABS: 50.0000 mg | ORAL_TABLET | Freq: Every evening | ORAL | Status: DC | PRN

## 2022-04-17 MED ORDER — ALUM & MAG HYDROXIDE-SIMETH 200-200-20 MG/5ML PO SUSP: 30.0000 mL | ORAL | Status: DC | PRN

## 2022-04-17 MED ORDER — ACETAMINOPHEN 325 MG PO TABS: 650.0000 mg | ORAL_TABLET | Freq: Four times a day (QID) | ORAL | Status: DC | PRN

## 2022-04-17 MED ORDER — NICOTINE 21 MG/24HR TD PT24: 21.0000 mg | MEDICATED_PATCH | Freq: Every day | TRANSDERMAL | Status: DC

## 2022-04-17 NOTE — ED Notes (Signed)
Patient cooperative with admission assessment, arrived on the unit, unable to obtain blood from patient and three other RN's attempted. 1 gold top tube was obtained and sent to the lab for TSH. The rest will be drawn this evening at [facility]. Report called to Paramedic at [facility] and Safe transport notified.
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
## 2022-04-17 NOTE — Progress Notes (Signed)
   04/17/22 2249  Psych Admission Type (Psych Patients Only)  Admission Status Voluntary  Psychosocial Assessment  Patient Complaints Anxiety;Crying spells;Depression;Sleep disturbance  Eye Contact Brief  Facial Expression Sad;Anxious  Affect Anxious;Apprehensive;Sad;Depressed  Speech Logical/coherent  Interaction Assertive  Motor Activity Slow  Appearance/Hygiene Unremarkable  Behavior Characteristics Anxious;Cooperative  Mood Anxious;Depressed;Sad;Apprehensive  Thought Process  Coherency WDL  Content WDL  Delusions None reported or observed  Perception WDL  Hallucination None reported or observed  Judgment Limited  Confusion None  Danger to Self  Current suicidal ideation? Denies  Description of Suicide Plan Verbal  Self-Injurious Behavior No self-injurious ideation or behavior indicators observed or expressed   Agreement Not to Harm Self Yes  Description of Agreement Verbal  Danger to Others  Danger to Others None reported or observed   Patient alert and oriented and presents very tearful, apprehensive and depressed. Denies SI, HI, AVH, and pain. Patient states "I just want to go home and see my kids, it's just making it worse by being here. The doctor I spoke with at the other place told me I am voluntary, so I could get either my cousin or my grandma to come stay with me if they let me go." Patient was educated on 72 hour request for discharge and decided to speak with the provider in the am instead of signing papers at this time. Patient reports they have had issues with sleep and rates anxiety and depression 10/10. Scheduled medications administered to patient, per provider orders. Support and encouragement provided. Routine safety checks conducted every 15 minutes. Patient contracts for safety and remains safe on the unit.
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
## 2022-04-17 NOTE — Telephone Encounter (Signed)
Thank you Lauren. Ok to schedule for new patient appointment.
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
## 2022-04-17 NOTE — Discharge Instructions (Signed)
Dear Lorraine Rogers,  Most effective treatment for your mental health disease involves BOTH a psychiatrist AND a therapist Psychiatrist to manage medications Therapist to help identify personal goals, barriers from those goals, and plan to achieve those goals by understanding emotions Please make regular appointments with an outpatient psychiatrist and other doctors once you leave the hospital (if any, otherwise, please see below for resources to make an appointment).  For therapy outside the hospital, please ask for these specific types of therapy: DBT ________________________________________________________  SAFETY CRISIS  Dial 988 for [place]    Text [phone] for Crisis Text Line:     [facility]:  [address] FIRST FLOOR.  [place], [place] [address].  [phone]  Mobile Crisis Response Teams Listed by counties in vicinity of [place]. [phone] [place] [phone] [place] [phone] [facility] [phone] [place] [phone] [place]. [phone] [place].  [place] [phone] ________________________________________________________  To see which pharmacy near you is the CHEAPEST for certain medications, please use GoodRx. It is free website and has a free phone app.    Also consider looking at [facility] $4.00 or [facility]'s $7.00 prescription list. Both are free to view if googled "[facility] $4 prescription" and "public's $7 prescription". These are set prices, no insurance required. [facility]'s low cost medications: $4-$15 for 30days  prescriptions or $10-$38 for 90days prescriptions  ________________________________________________________  Difficulties with sleep?   Can also use this free app for insomnia called CBT-I. Let your doctors and therapists know so they can help with extra tips and tricks or for guidance and accountability. NO ADDS on the app.     ________________________________________________________  Non-Emergent / Urgent  [facility] [address] [place], [place] [address] [phone] OUTPATIENT Walk-in information: Please note, all walk-ins are first come & first serve, with limited number of availability.  Please note that to be eligible for services you must bring: ID or a piece of mail with your name [facility] address  Therapist for therapy:  Monday & Wednesdays: Please ARRIVE at 7:15 AM for registration Will START at 8:00 AM Every 1st & 2nd Friday of the month: Please ARRIVE at 10:15 AM for registration Will START at 1 PM - 5 PM  Psychiatrist for medication management: Monday - Friday:  Please ARRIVE at 7:15 AM for registration Will START at 8:00 AM  Regretfully, due to limited availability, please be aware that you may not been seen on the same day as walk-in. Please consider making an appoint or try again. Thank you for your patience and understanding.
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
## 2022-04-17 NOTE — Plan of Care (Signed)
Nurse discussed coping skills with patient.  

## 2022-04-17 NOTE — Telephone Encounter (Signed)
Attempted to contact patient to see how she is doing this PM, and to schedule appt with [facility] to establish care. Left detailed message on patient's self-identified VM that she has appt at [facility] on 10/27 at 1045.   Of note, realized after phone call that patient is currently admitted with [facility].

## 2022-04-17 NOTE — BHH Group Notes (Signed)
Pt did not attend AA meeting 

## 2022-04-17 NOTE — Progress Notes (Signed)
Pt was accepted to [facility] 04/02/22: Bed Assignment 306-2 pending labs, covid, and voluntary consent signed and faxed to [phone]  Pt meets inpatient criteria per Merrily Brittle, DO,  Attending Physician will be Dr. Janine Limbo  Report can be called to: Adult unit: [phone]  Pt can arrive after PENDING ITEMS  Care Team notified: [place] Lynnda Shields, RN, Merrily Brittle, DO, Nelva Bush, RN  [place], [place] 04/17/2022 @ 12:49 PM

## 2022-04-17 NOTE — Plan of Care (Signed)
Discussed coping skills with patient. 

## 2022-04-17 NOTE — Tx Team (Signed)
Initial Treatment Plan 04/17/2022 6:48 PM Markus Jarvis [id number]    PATIENT STRESSORS: Financial difficulties   Loss of mother   Occupational concerns   Substance abuse   Traumatic event     PATIENT STRENGTHS: Average or above average intelligence  Capable of independent living  Communication skills  General fund of knowledge  Motivation for treatment/growth  Physical Health  Supportive family/friends  Work skills    PATIENT IDENTIFIED PROBLEMS: "Anxiety"  "Depression"  "Substance abuse"  "Suicide thoughts/actions"               DISCHARGE CRITERIA:  Ability to meet basic life and health needs Adequate post-discharge living arrangements Improved stabilization in mood, thinking, and/or behavior Medical problems require only outpatient monitoring Motivation to continue treatment in a less acute level of care Need for constant or close observation no longer present Reduction of life-threatening or endangering symptoms to within safe limits Safe-care adequate arrangements made Verbal commitment to aftercare and medication compliance Withdrawal symptoms are absent or subacute and managed without 24-hour nursing intervention  PRELIMINARY DISCHARGE PLAN: Attend aftercare/continuing care group Attend PHP/IOP Attend 12-step recovery group Outpatient therapy Participate in family therapy Return to previous living arrangement  PATIENT/FAMILY INVOLVEMENT: This treatment plan has been presented to and reviewed with the patient, Lorraine Rogers.  The patient and family have been given the opportunity to ask questions and make suggestions.  Grayland Ormond [place], RN 04/17/2022, 6:48 PM

## 2022-04-18 MED ORDER — FLUOXETINE HCL 20 MG PO CAPS
20.0000 mg | ORAL_CAPSULE | Freq: Every day | ORAL | Status: DC
  Administered 2022-04-18 – 2022-04-21 (×4): 20 mg via ORAL
  Filled 2022-04-18 (×6): qty 1

## 2022-04-18 MED ORDER — TRAZODONE HCL 50 MG PO TABS: 50.0000 mg | ORAL_TABLET | Freq: Every evening | ORAL | Status: DC | PRN

## 2022-04-18 MED ORDER — HYDROXYZINE HCL 25 MG PO TABS: 25.0000 mg | ORAL_TABLET | Freq: Three times a day (TID) | ORAL | Status: DC | PRN

## 2022-04-18 NOTE — Progress Notes (Signed)
Major depressive disorder, recurrent, severe, without psychotic features 

## 2022-04-18 NOTE — Group Note (Signed)
Recreation Therapy Group Note   Group Topic:Animal Assisted Therapy   Group Date: 04/18/2022 Start Time: 1430 End Time: 1512 Facilitators: Ofilia Rayon-McCall, LRT,CTRS Location: [address] Dayroom   Animal-Assisted Activity (AAA) Program Checklist/Progress Notes Patient Eligibility Criteria Checklist & Daily Group note for Rec Tx Intervention   AAA/T Program Assumption of Risk Form signed by Patient/ or Parent Legal Guardian Yes   Patient understands his/her participation is voluntary Yes   Affect/Mood: N/A   Participation Level: Did not attend    Clinical Observations/Individualized Feedback:     Plan: Continue to engage patient in RT group sessions 2-3x/week.   Zoey Gilkeson-McCall, LRT,CTRS  04/18/2022 3:34 PM
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
## 2022-04-18 NOTE — B[facility] Suicide Risk Assessment (Signed)
[place] INPATIENT:  Family/Significant Other Suicide Prevention Education  Suicide Prevention Education:  Education Completed;04-18-2022 Delanna Ahmadi (Godmother) [phone] has been identified by the patient as the family member/significant other with whom the patient will be residing, and identified as the person(s) who will aid the patient in the event of a mental health crisis (suicidal ideations/suicide attempt).  With written consent from the patient, the family member/significant other has been provided the following suicide prevention education, prior to the and/or following the discharge of the patient.  The suicide prevention education provided includes the following: Suicide risk factors Suicide prevention and interventions National Suicide Hotline telephone number [facility] assessment telephone number [facility] Emergency Assistance [place] and/or Residential Mobile Crisis Unit telephone number  Request made of family/significant other to: Remove weapons (e.g., guns, rifles, knives), all items previously/currently identified as safety concern.   Remove drugs/medications (over-the-counter, prescriptions, illicit drugs), all items previously/currently identified as a safety concern.  The family member/significant other verbalizes understanding of the suicide prevention education information provided.  The family member/significant other agrees to remove the items of safety concern listed above.  [place] MSW, LCSW 04/18/2022, 3:16 PM

## 2022-04-18 NOTE — Progress Notes (Signed)
Pt presents with depressed mood, affect congruent. Lorraine Rogers is approached in her room, asleep. Pt has not been out of room thus far this shift, and has been isolative thus far. Pt denies any major acute concerns, stating her goal is '' to discharge today . I still want to go home. '' She reports she slept better last night and denies any SI or HI or A/V Hallucinations.  Pt has not completed self inventory this shift thus far, but is able to make her needs known and denies any concerns as of this writing. Pt is safe, above discussed in treatment team with MD, SW and staff. Pt is safe, will con't to monitor.

## 2022-04-18 NOTE — B[facility] Group Notes (Signed)
Adult Psychoeducational Group Note  Date:  04/18/2022 Time:  8:52 PM  Group Topic/Focus:  Wrap-Up Group:   The focus of this group is to help patients review their daily goal of treatment and discuss progress on daily workbooks.  Participation Level:  Minimal  Participation Quality:  Appropriate  Affect:  Depressed and Flat  Cognitive:  Alert and Appropriate  Insight: Appropriate and Good  Engagement in Group:  Developing/Improving  Modes of Intervention:  Discussion, Education, Problem-solving, and Support  Additional Comments:  Pt attended and participated in wrap up group this evening and rated their day a 7/10. Pt states that staff "finally listened" and allowed their children to come and visit which helped with their mood. Pt goal is to be "emotionally stable", deal with grief and to learn coping skills.   Cristi Loron 04/18/2022, 8:52 PM

## 2022-04-18 NOTE — Progress Notes (Signed)
Pt increasingly irritable, up at the desk , crying stating '' I want to go home. You need to tell the doctor that I came in here voluntarily and that I want to leave. They told me they would commit me but my children are on the phone crying begging me to come home to them and I did this on my own and I will be ok. ''  Allowed pt to ventilate and support given. Pt offered to sign 72 hr Request for discharge but became frustrated as she states '' I don't have time to wait 72 hours!''  Patient also offered solution that writer would request MD to place order for special visitation for minors as this has been accommodated in the past.  Patient declined this, as she seems fixated on discharge immediately. Writer again reiterated pt would not be discharging tonight. Pt went back to phone and is observed crying while speaking with family. Will con't to monitor.

## 2022-04-19 ENCOUNTER — Encounter (HOSPITAL_COMMUNITY): Payer: Self-pay

## 2022-04-19 LAB — CBC WITH DIFFERENTIAL/PLATELET
Abs Immature Granulocytes: 0.04 10*3/uL (ref 0.00–0.07)
Basophils Absolute: 0 10*3/uL (ref 0.0–0.1)
Basophils Relative: 0 %
Eosinophils Absolute: 0.5 10*3/uL (ref 0.0–0.5)
Eosinophils Relative: 4 %
HCT: 44.8 % (ref 36.0–46.0)
Hemoglobin: 14.5 g/dL (ref 12.0–15.0)
Immature Granulocytes: 0 %
Lymphocytes Relative: 35 %
Lymphs Abs: 3.8 10*3/uL (ref 0.7–4.0)
MCH: 30.5 pg (ref 26.0–34.0)
MCHC: 32.4 g/dL (ref 30.0–36.0)
MCV: 94.1 fL (ref 80.0–100.0)
Monocytes Absolute: 0.6 10*3/uL (ref 0.1–1.0)
Monocytes Relative: 6 %
Neutro Abs: 5.9 10*3/uL (ref 1.7–7.7)
Neutrophils Relative %: 55 %
Platelets: 283 10*3/uL (ref 150–400)
RBC: 4.76 MIL/uL (ref 3.87–5.11)
RDW: 13.4 % (ref 11.5–15.5)
WBC: 10.9 10*3/uL — ABNORMAL HIGH (ref 4.0–10.5)
nRBC: 0 % (ref 0.0–0.2)

## 2022-04-19 LAB — COMPREHENSIVE METABOLIC PANEL
ALT: 15 U/L (ref 0–44)
AST: 17 U/L (ref 15–41)
Albumin: 4.1 g/dL (ref 3.5–5.0)
Alkaline Phosphatase: 49 U/L (ref 38–126)
Anion gap: 9 (ref 5–15)
BUN: 12 mg/dL (ref 6–20)
CO2: 20 mmol/L — ABNORMAL LOW (ref 22–32)
Calcium: 9.1 mg/dL (ref 8.9–10.3)
Chloride: 108 mmol/L (ref 98–111)
Creatinine, Ser: 0.77 mg/dL (ref 0.44–1.00)
GFR, Estimated: >60 mL/min (ref >60)
Glucose, Bld: 98 mg/dL (ref 70–99)
Potassium: 3.7 mmol/L (ref 3.5–5.1)
Sodium: 137 mmol/L (ref 135–145)
Total Bilirubin: 1.1 mg/dL (ref 0.3–1.2)
Total Protein: 7.8 g/dL (ref 6.5–8.1)

## 2022-04-19 MED ORDER — IBUPROFEN 600 MG PO TABS
600.0000 mg | ORAL_TABLET | Freq: Three times a day (TID) | ORAL | Status: DC | PRN
  Administered 2022-04-19 – 2022-04-21 (×4): 600 mg via ORAL
  Filled 2022-04-19 (×3): qty 1

## 2022-04-19 MED ORDER — ACETAMINOPHEN 325 MG PO TABS
650.0000 mg | ORAL_TABLET | Freq: Four times a day (QID) | ORAL | Status: DC | PRN
  Administered 2022-04-19: 650 mg via ORAL

## 2022-04-19 NOTE — B[facility] Group Notes (Signed)
Patient attended NA meeting.  

## 2022-04-19 NOTE — BH IP Treatment Plan (Signed)
Interdisciplinary Treatment and Diagnostic Plan Update  04/19/2022 Time of Session: [place] MRN: [id number]  Principal Diagnosis: MDD (major depressive disorder), severe (Lorraine Rogers)  Secondary Diagnoses: Principal Problem:   MDD (major depressive disorder), severe (HCC)   Current Medications:  Current Facility-Administered Medications  Medication Dose Route Frequency Provider Last Rate Last Admin   acetaminophen (TYLENOL) tablet 650 mg  650 mg Oral Q6H PRN Nwoko, Uchenna E, PA   650 mg at 04/19/22 1148   FLUoxetine (PROZAC) capsule 20 mg  20 mg Oral Daily Nwoko, Uchenna E, PA   20 mg at 04/19/22 0749   hydrOXYzine (ATARAX) tablet 25 mg  25 mg Oral TID PRN Nwoko, Uchenna E, PA       melatonin tablet 5 mg  5 mg Oral QHS PRN Onuoha, Chinwendu V, NP   5 mg at 04/18/22 2121   nicotine polacrilex (NICORETTE) gum 4 mg  4 mg Oral PRN Merrily Brittle, DO       traZODone (DESYREL) tablet 50 mg  50 mg Oral QHS PRN Nwoko, Uchenna E, PA       PTA Medications: No medications prior to admission.    Patient Stressors: Financial difficulties   Loss of mother   Occupational concerns   Substance abuse   Traumatic event    Patient Strengths: Average or above average intelligence  Capable of independent living  Communication skills  General fund of knowledge  Motivation for treatment/growth  Physical Health  Supportive family/friends  Work skills   Treatment Modalities: Medication Management, Group therapy, Case management,  1 to 1 session with clinician, Psychoeducation, Recreational therapy.   Physician Treatment Plan for Primary Diagnosis: MDD (major depressive disorder), severe ([name]) Long Term Goal(s): Improvement in symptoms so as ready for discharge   Short Term Goals: Ability to identify changes in lifestyle to reduce recurrence of condition will improve Ability to verbalize feelings will improve Ability to disclose and discuss suicidal ideas Ability to demonstrate self-control  will improve Ability to identify and develop effective coping behaviors will improve Ability to maintain clinical measurements within normal limits will improve Compliance with prescribed medications will improve Ability to identify triggers associated with substance abuse/mental health issues will improve  Medication Management: Evaluate patient's response, side effects, and tolerance of medication regimen.  Therapeutic Interventions: 1 to 1 sessions, Unit Group sessions and Medication administration.  Evaluation of Outcomes: Progressing  Physician Treatment Plan for Secondary Diagnosis: Principal Problem:   MDD (major depressive disorder), severe ([name])  Long Term Goal(s): Improvement in symptoms so as ready for discharge   Short Term Goals: Ability to identify changes in lifestyle to reduce recurrence of condition will improve Ability to verbalize feelings will improve Ability to disclose and discuss suicidal ideas Ability to demonstrate self-control will improve Ability to identify and develop effective coping behaviors will improve Ability to maintain clinical measurements within normal limits will improve Compliance with prescribed medications will improve Ability to identify triggers associated with substance abuse/mental health issues will improve     Medication Management: Evaluate patient's response, side effects, and tolerance of medication regimen.  Therapeutic Interventions: 1 to 1 sessions, Unit Group sessions and Medication administration.  Evaluation of Outcomes: Progressing   RN Treatment Plan for Primary Diagnosis: MDD (major depressive disorder), severe ([name]) Long Term Goal(s): Knowledge of disease and therapeutic regimen to maintain health will improve  Short Term Goals: Ability to remain free from injury will improve, Ability to verbalize frustration and anger appropriately will improve, Ability to demonstrate self-control,  Ability to participate in decision  making will improve, Ability to verbalize feelings will improve, Ability to disclose and discuss suicidal ideas, Ability to identify and develop effective coping behaviors will improve, and Compliance with prescribed medications will improve  Medication Management: RN will administer medications as ordered by provider, will assess and evaluate patient's response and provide education to patient for prescribed medication. RN will report any adverse and/or side effects to prescribing provider.  Therapeutic Interventions: 1 on 1 counseling sessions, Psychoeducation, Medication administration, Evaluate responses to treatment, Monitor vital signs and CBGs as ordered, Perform/monitor CIWA, COWS, AIMS and Fall Risk screenings as ordered, Perform wound care treatments as ordered.  Evaluation of Outcomes: Progressing   LCSW Treatment Plan for Primary Diagnosis: MDD (major depressive disorder), severe ([name]) Long Term Goal(s): Safe transition to appropriate next level of care at discharge, Engage patient in therapeutic group addressing interpersonal concerns.  Short Term Goals: Engage patient in aftercare planning with referrals and resources, Increase social support, Increase ability to appropriately verbalize feelings, Increase emotional regulation, Facilitate acceptance of mental health diagnosis and concerns, Facilitate patient progression through stages of change regarding substance use diagnoses and concerns, Identify triggers associated with mental health/substance abuse issues, and Increase skills for wellness and recovery  Therapeutic Interventions: Assess for all discharge needs, 1 to 1 time with Social worker, Explore available resources and support systems, Assess for adequacy in community support network, Educate family and significant other(s) on suicide prevention, Complete Psychosocial Assessment, Interpersonal group therapy.  Evaluation of Outcomes: Progressing   Progress in  Treatment: Attending groups: Yes. Participating in groups: No. Taking medication as prescribed: Yes. Toleration medication: Yes. Family/Significant other contact made: Yes, individual(s) contacted:  Delanna Ahmadi ([place]) [phone]  Patient understands diagnosis: Yes. Discussing patient identified problems/goals with staff: Yes. Medical problems stabilized or resolved: Yes. Denies suicidal/homicidal ideation: No. Issues/concerns per patient self-inventory: Yes. Other: none  New problem(s) identified: No, Describe:  none  New Short Term/Long Term Goal(s): Patient to work towards medication management for mood stabilization; elimination of SI thoughts; development of comprehensive mental wellness plan.  Patient Goals:  Patient states their goal for treatment is to "trying to get out of here sooner and get better sleep."  Discharge Plan or Barriers: No psychosocial barriers identified at this time, patient to return to place of residence when appropriate for discharge.   Reason for Continuation of Hospitalization: Depression Medication stabilization  Estimated Length of Stay: 1-7 days   Last 3 [place] Suicide Severity Risk Score: Flowsheet Row Admission (Current) from 04/17/2022 in [facility] Most recent reading at 04/17/2022  6:12 PM ED from 04/17/2022 in [facility] Most recent reading at 04/17/2022 12:21 PM  C-SSRS RISK CATEGORY High Risk Low Risk       Last PHQ 2/9 Scores:    04/17/2022   12:21 PM 04/24/2018    2:32 PM 04/18/2018    1:27 PM  Depression screen PHQ 2/9  Decreased Interest 3 0 0  Down, Depressed, Hopeless 3 0 0  PHQ - 2 Score 6 0 0  Altered sleeping 1 0 0  Tired, decreased energy 1 0 0  Change in appetite 1 0 0  Feeling bad or failure about yourself  2 0 0  Trouble concentrating 1 0 0  Moving slowly or fidgety/restless 0 0 0  Suicidal thoughts 2 0 0  PHQ-9 Score 14 0 0   Difficult doing work/chores Very difficult      Scribe  for Treatment Team: Larose Kells 04/19/2022 1:13 PM

## 2022-04-19 NOTE — BHH Group Notes (Signed)
Pt did not attend group. 
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
## 2022-04-19 NOTE — Group Note (Signed)
LCSW Group Therapy Note  Group Date: 04/19/2022 Start Time: 1300 End Time: 1400   Type of Therapy and Topic:  Group Therapy: Positive Affirmations  Participation Level:  Did Not Attend   Description of Group:   This group addressed positive affirmation towards self and others.  Patients went around the room and identified two positive things about themselves and two positive things about a peer in the room.  Patients reflected on how it felt to share something positive with others, to identify positive things about themselves, and to hear positive things from others/ Patients were encouraged to have a daily reflection of positive characteristics or circumstances.   Therapeutic Goals: Patients will verbalize two of their positive qualities Patients will demonstrate empathy for others by stating two positive qualities about a peer in the group Patients will verbalize their feelings when voicing positive self affirmations and when voicing positive affirmations of others Patients will discuss the potential positive impact on their wellness/recovery of focusing on positive traits of self and others.        [place], LCSW 04/19/2022  3:40 PM

## 2022-04-19 NOTE — Progress Notes (Signed)
Pt stated she felt a little better this evening, pt said she got to see her daughter     04/19/22 0000  Psych Admission Type (Psych Patients Only)  Admission Status Voluntary  Psychosocial Assessment  Patient Complaints Depression  Eye Contact Brief  Facial Expression Sad;Anxious  Affect Apprehensive;Depressed;Sad;Preoccupied  Speech Logical/coherent  Interaction Assertive  Motor Activity Slow  Appearance/Hygiene Unremarkable  Behavior Characteristics Cooperative  Mood Depressed  Aggressive Behavior  Effect No apparent injury  Thought Process  Coherency WDL  Content WDL  Delusions WDL  Perception WDL  Hallucination None reported or observed  Judgment WDL  Confusion None  Danger to Self  Current suicidal ideation? Denies  Danger to Others  Danger to Others None reported or observed

## 2022-04-19 NOTE — Group Note (Signed)
Recreation Therapy Group Note   Group Topic:Stress Management  Group Date: 04/19/2022 Start Time: 0930 End Time: 0950 Facilitators: Harlow Basley-McCall, LRT,CTRS Location: [address] Day[address]   Goal Area(s) Addresses:  Patient will identify positive stress management techniques. Patient will identify benefits of using stress management post d/c.  Group Description:  Meditation.  LRT played a meditation that focused on having a mindful morning.  Patients were to listen and follow along as meditation played to fully engage in the group session.  Patients were to relax and get as comfortable as possible to get the full experience of the meditation.   Affect/Mood: Appropriate   Participation Level: Active   Participation Quality: Independent   Behavior: Appropriate   Speech/Thought Process: Focused   Insight: Good   Judgement: Good   Modes of Intervention: Meditation   Patient Response to Interventions:  Attentive   Education Outcome:  Acknowledges education and In group clarification offered    Clinical Observations/Individualized Feedback: Pt attended and participated in group session.     Plan: Continue to engage patient in RT group sessions 2-3x/week.   Barrie Wale-McCall, LRT,CTRS 04/19/2022 12:09 PM

## 2022-04-19 NOTE — Progress Notes (Signed)
Adult Psychoeducational Group Note   Date:  10/[id number] Time:  10:30 am  Group Topic/Focus:  Wrap-Up Group:   The focus of this group is to give patients the opportunity to provide feedback on the therapeutic environment, programming, and staff interactions during their hospital stay.     Participation Level:  Minimal participation   Participation Quality:  Responded only when addressed, in single words.   Affect: Sad, little eye contact   Cognitive:  Unable to assess due to non-participation   Insight: Unable to assess due to non-participation      Engagement in Group: Minimal.   Modes of Intervention:  Discussion   Additional Comments:   Quiet, spoke only when addressed.  Appeared to be listening to others' comments.     Cordelia Pen, RN

## 2022-04-19 NOTE — Progress Notes (Signed)
   04/19/22 0545  Sleep  Number of Hours 7

## 2022-04-19 NOTE — [facility] Group Notes (Signed)
Adult Psychoeducational Group Note  Date:  04/19/2022 Time:  11:34 AM  Group Topic/Focus:  Goals Group:   The focus of this group is to help patients establish daily goals to achieve during treatment and discuss how the patient can incorporate goal setting into their daily lives to aide in recovery.  Participation Level:  Minimal  Participation Quality:  Appropriate  Affect:  Appropriate  Cognitive:  Appropriate  Insight: Good and Improving  Engagement in Group:  Engaged  Modes of Intervention:  Education  Additional Comments:  Pt goal today is to  get home to her babies.Pt stated that she got the mental break she needed and ready to get back to being the best mother she can be.  Jasmarie Coppock, Georgiann Mccoy 04/19/2022, 11:34 AM

## 2022-04-19 NOTE — Progress Notes (Signed)
   04/19/22 2300  Psych Admission Type (Psych Patients Only)  Admission Status Voluntary  Psychosocial Assessment  Patient Complaints Anxiety  Eye Contact Brief  Facial Expression Sad;Anxious  Affect Apprehensive;Depressed;Sad;Preoccupied  Speech Logical/coherent  Interaction Assertive  Motor Activity Slow  Appearance/Hygiene Unremarkable  Behavior Characteristics Cooperative  Mood Depressed;Anxious  Aggressive Behavior  Effect No apparent injury  Thought Process  Coherency WDL  Content WDL  Delusions WDL  Perception WDL  Hallucination None reported or observed  Judgment WDL  Confusion None  Danger to Self  Current suicidal ideation? Denies  Danger to Others  Danger to Others None reported or observed

## 2022-04-19 NOTE — Progress Notes (Signed)
Pt presents with depressed mood, affect congruent. Lorraine Rogers is approached in the hallway today, and her affect appears brighter, she is smiling more with staff and is less isolative today.  Pt states '' I do think I am getting better. I was able to sleep and I think that was what most of this is triggered by just overwhelm with not sleeping and I have been getting rest and seeing my child helped. Last night it would have been non stop tears so I didn't do that so I do think that is better. '' Pt does still state her goal would be to '' to discharge today . I still want to go home to my babies '' She denies any SI or HI or A/V Hallucinations.  Pt has completed self inventory and rates her depression at a 3/10 on scale 10 being worst 0 being none. Pt rates her hopelessness at 0/10 on same scale and her anxiety at 2/10 on scale 0 being none 10 being worst.  Pt is able to make her needs known and denies any concerns as of this writing. Pt is safe, above discussed in treatment team with MD, SW and staff. Pt is safe, will con't to monitor.
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
## 2022-04-20 NOTE — Progress Notes (Signed)
[place] Group Notes:  (Nursing/MHT/Case Management/Adjunct)  Date:  04/20/2022  Time:  2000 Type of Therapy:   wrap up group  Participation Level:  Active  Participation Quality:  Appropriate, Attentive, Sharing, and Supportive  Affect:  Depressed  Cognitive:  Alert  Insight:  Improving  Engagement in Group:  Engaged  Modes of Intervention:  Clarification, Education, and Support  Summary of Progress/Problems: Positive thinking and positive change were discussed.   Shellia Cleverly 04/20/2022, 8:55 PM

## 2022-04-20 NOTE — Progress Notes (Addendum)
   04/20/22 1300  Psych Admission Type (Psych Patients Only)  Admission Status Voluntary  Psychosocial Assessment  Patient Complaints None  Eye Contact Fair  Facial Expression Sad  Affect Appropriate to circumstance  Speech Logical/coherent  Interaction Assertive  Motor Activity Slow  Appearance/Hygiene Unremarkable  Behavior Characteristics Cooperative;Appropriate to situation;Anxious  Mood Pleasant;Euthymic  Thought Process  Coherency WDL  Content WDL  Delusions None reported or observed  Perception WDL  Hallucination None reported or observed  Judgment WDL  Confusion None  Danger to Self  Current suicidal ideation? Denies  Self-Injurious Behavior No self-injurious ideation or behavior indicators observed or expressed   Agreement Not to Harm Self Yes  Description of Agreement verbal  Danger to Others  Danger to Others None reported or observed  Danger to Others Abnormal  Harmful Behavior to others No threats or harm toward other people  Destructive Behavior No threats or harm toward property

## 2022-04-20 NOTE — Progress Notes (Signed)
   04/20/22 0600  Sleep  Number of Hours 7

## 2022-04-21 DIAGNOSIS — F322 Major depressive disorder, single episode, severe without psychotic features: Principal | ICD-10-CM

## 2022-04-21 MED ORDER — MELATONIN 5 MG PO TABS: 5.0000 mg | ORAL_TABLET | Freq: Every evening | ORAL | 0 refills | Status: DC | PRN

## 2022-04-21 MED ORDER — FLUOXETINE HCL 20 MG PO CAPS: 20.0000 mg | ORAL_CAPSULE | Freq: Every day | ORAL | 0 refills | Status: DC

## 2022-04-21 MED ORDER — NICOTINE POLACRILEX 4 MG MT GUM: 4.0000 mg | CHEWING_GUM | OROMUCOSAL | 0 refills | Status: DC | PRN

## 2022-04-21 MED ORDER — HYDROXYZINE HCL 25 MG PO TABS: 25.0000 mg | ORAL_TABLET | Freq: Three times a day (TID) | ORAL | 0 refills | Status: DC | PRN

## 2022-04-21 MED ORDER — TRAZODONE HCL 50 MG PO TABS: 50.0000 mg | ORAL_TABLET | Freq: Every evening | ORAL | 0 refills | Status: DC | PRN
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
## 2022-04-21 NOTE — BHH Suicide Risk Assessment (Signed)
[place] INPATIENT:  Family/Significant Other Suicide Prevention Education  Suicide Prevention Education:  Education Completed; 04-21-2022 has been identified by the patient as the Van ([place]) [phone] with whom the patient will be residing, and identified as the person(s) who will aid the patient in the event of a mental health crisis (suicidal ideations/suicide attempt).  With written consent from the patient, the family member/significant other has been provided the following suicide prevention education, prior to the and/or following the discharge of the patient.  The suicide prevention education provided includes the following: Suicide risk factors Suicide prevention and interventions National Suicide Hotline telephone number [facility] assessment telephone number [facility] Emergency Assistance [place] and/or Residential Mobile Crisis Unit telephone number  Request made of family/significant other to: Remove weapons (e.g., guns, rifles, knives), all items previously/currently identified as safety concern.   Remove drugs/medications (over-the-counter, prescriptions, illicit drugs), all items previously/currently identified as a safety concern.   Lorraine Rogers (Lorraine Rogers) [phone] verbalizes understanding of the suicide prevention education information provided.  The family member/significant other agrees to remove the items of safety concern listed above.  [place] MSW, LCSW 04/21/2022, 10:42 AM

## 2022-04-21 NOTE — Progress Notes (Signed)
D- Patient alert and oriented. Denies SI, HI, AVH. Patient reported pain of 6/10 on the right side of her jaw. Patient stated that heat was unhelpful and that cold made the pain worse. At the time of assessment, patient was not due for medication and was asleep before the time to administer came about. Patient spoke to this RN at length about how she feels that her stay is "exactly what I needed." The patient reported that she feels that she is processing her mother's death and being inpatient with the opportunity to care for herself has helped. The patient reported that her depression and anxiety scores are both a 0/10. The patient's facial expressions were animated and the patient smiled appropriately.  A- Support and encouragement provided. This RN educated the patient on grounding exercises for when she had "flashbacks" of her mother's death. The patient verbalized understanding. This RN educated on therapy and it's role in the grieving process. The patient stated that she would look for a therapist once discharged. PRN melatonin administered. Routine safety checks conducted every 15 minutes.  Patient informed to notify staff with problems or concerns.  R- No adverse drug reactions noted. Patient contracts for safety at this time. Patient compliant with medications and treatment plan. Patient receptive, calm, and cooperative. Patient interacts well with others on the unit. Patient verbalized understanding of education provided. Patient remains safe at this time.

## 2022-04-21 NOTE — Progress Notes (Signed)
  [facility] Adult Case Management Discharge Plan :  Will you be returning to the same living situation after discharge:  Yes,  Pt will return to her home where, she and her minor children reside with daily visitation from her Godmother At discharge, do you have transportation home?: Yes,  family member will be providing transportation Do you have the ability to pay for your medications: Yes,  discount cards provided  Release of information consent forms completed and in the chart;  Patient's signature needed at discharge.  Patient to Follow up at:  [place] Follow up.   Specialty: Behavioral Health Why: Please call or go to this provider to schedule an assessment, to obtain therapy and medication management services; on a Monday or a Wednesday morning, arrive by 7:30 am.  Services are provided on a first come, first served basis.  Once you have scheduled and completed your assessment, appointments will be scheduled for therapy and medication management. Contact information: [place] [phone]                Next level of care provider has access to [place] and Suicide Prevention discussed: Yes,   Delanna Ahmadi (Godmother) [phone]     Has patient been referred to the Quitline?: N/A patient is not a smoker  Patient has been referred for addiction treatment: N/A Patient to continue working towards treatment goals after discharge. Patient no longer meets criteria for inpatient criteria per attending physician. Continue taking medications as prescribed, nursing to provide instructions at discharge. Follow up with all scheduled appointments.    [place], LCSW 04/21/2022, 10:38 AM

## 2022-04-21 NOTE — Plan of Care (Signed)
  Problem: Education: Goal: Ability to state activities that reduce stress will improve Outcome: Progressing   Problem: Education: Goal: Utilization of techniques to improve thought processes will improve Outcome: Progressing Goal: Knowledge of the prescribed therapeutic regimen will improve Outcome: Progressing   Problem: Education: Goal: Knowledge of disease or condition will improve Outcome: Progressing Goal: Understanding of discharge needs will improve Outcome: Progressing   Problem: Education: Goal: Ability to make informed decisions regarding treatment will improve Outcome: Progressing   

## 2022-04-21 NOTE — Progress Notes (Signed)
   04/21/22 0555  Sleep  Number of Hours 7

## 2022-04-21 NOTE — Progress Notes (Signed)
Discharge note: RN met with pt and reviewed pt's discharge instructions. Pt verbalized understanding of discharge instructions and pt did not have any questions. RN reviewed and provided pt with a copy of pt's Suicide Safety Risk Plan, SRA, AVS and Transition Record. RN returned pt's belongings to pt. Medication samples were given to pt. Pt denied SI/HI/AVH and voiced no concerns. Pt was appreciative of the care pt received at [facility]. Patient discharged to lobby without incident.
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
## 2022-04-21 NOTE — Progress Notes (Signed)
Patient self inventory sheet, patient sleeps good, sleep medication helpful.  Good appetite, high energy level, good concentration.  Denied depression, hopeless and anxiety.  Denied withdrawals.  Denied SI.  Denied physical problems.  Denied physical pain.  Goal is maintaining the things learned at [facility].  Plans to become selfish for the sake of self and my kids.  Does have discharge plans.

## 2022-04-28 ENCOUNTER — Ambulatory Visit: Payer: Medicaid Other | Admitting: Internal Medicine
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
## 2022-05-05 ENCOUNTER — Ambulatory Visit: Payer: Medicaid Other | Admitting: Internal Medicine

## 2022-05-16 ENCOUNTER — Ambulatory Visit (INDEPENDENT_AMBULATORY_CARE_PROVIDER_SITE_OTHER): Payer: Self-pay

## 2022-05-16 VITALS — BP 124/68 | HR 56 | Temp 98.4°F | Ht 67.0 in | Wt 253.7 lb

## 2022-05-16 DIAGNOSIS — F17210 Nicotine dependence, cigarettes, uncomplicated: Secondary | ICD-10-CM

## 2022-05-16 DIAGNOSIS — Z308 Encounter for other contraceptive management: Secondary | ICD-10-CM

## 2022-05-16 DIAGNOSIS — E669 Obesity, unspecified: Secondary | ICD-10-CM

## 2022-05-16 DIAGNOSIS — Z6839 Body mass index (BMI) 39.0-39.9, adult: Secondary | ICD-10-CM

## 2022-05-16 DIAGNOSIS — F17200 Nicotine dependence, unspecified, uncomplicated: Secondary | ICD-10-CM

## 2022-05-16 DIAGNOSIS — Z3009 Encounter for other general counseling and advice on contraception: Secondary | ICD-10-CM

## 2022-05-16 DIAGNOSIS — F322 Major depressive disorder, single episode, severe without psychotic features: Secondary | ICD-10-CM

## 2022-05-16 MED ORDER — FLUOXETINE HCL 20 MG PO CAPS: 40.0000 mg | ORAL_CAPSULE | Freq: Every day | ORAL | 1 refills | Status: DC

## 2022-05-16 NOTE — Patient Instructions (Signed)
Thank you, Ms.[name] for allowing [place] to provide your care today. Today we discussed :  Depression, anxiety, grief- I have increased your prozac to 40mg  daily. I think therapy is a good and effective option with medication. You mentioned getting counseling with [facility]. They also have specific grief counseling and support groups. You can call [phone] to schedule these things or visit [phone].  Contraception/womens health- We are referring you to the OBGYN to discuss tubal ligation and other contraception methods. They can also do your pap smear there.    Referrals ordered today:    Referral Orders         Ambulatory referral to Obstetrics / Gynecology      I have ordered the following medication/changed the following medications:   Stop the following medications: Medications Discontinued During This Encounter  Medication Reason   FLUoxetine (PROZAC) 20 MG capsule Reorder     Start the following medications: Meds ordered this encounter  Medications   FLUoxetine (PROZAC) 20 MG capsule    Sig: Take 2 capsules (40 mg total) by mouth daily.    Dispense:  60 capsule    Refill:  1     Follow up: 6 months     We look forward to seeing you next time. Please call our clinic at [phone] if you have any questions or concerns. The best time to call is Monday-Friday from 9am-4pm, but there is someone available 24/7. If after hours or the weekend, call the main hospital number and ask for the Internal Medicine Resident On-Call. If you need medication refills, please notify your pharmacy one week in advance and they will send [phone] a request.   Thank you for trusting me with your care. Wishing you the best!   [place], MD [facility]
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
## 2022-05-17 DIAGNOSIS — F17200 Nicotine dependence, unspecified, uncomplicated: Secondary | ICD-10-CM | POA: Insufficient documentation

## 2022-05-17 DIAGNOSIS — Z309 Encounter for contraceptive management, unspecified: Secondary | ICD-10-CM | POA: Insufficient documentation

## 2022-05-17 NOTE — Assessment & Plan Note (Signed)
Patient endorses 1/2 ppd use of cigarettes at this time. She has cut back some. At this time she is not wanting to stop as it is an anxious habit and she has been dealing with a lot from the recent loss of her mother, psychiatric hospitalization, mood and grief. She was given nicotine gum but has not used it. Will continue to discuss this at future visits.
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
## 2022-05-17 NOTE — Assessment & Plan Note (Signed)
Reports TIA in father at age 31. Should have A1c and lipid screening done at next visit. Can discuss at next visit current exercise and diet, thoughts on health and weight moving forward.
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
## 2022-05-17 NOTE — Assessment & Plan Note (Signed)
Patient endorses failed contraception on hormonal IUD and depot progesterone shots resulting in pregnancy. She recently had a nexplanon removed and does not wish to have this anymore as she did not like it. No first degree relatives with breast cancer, no personal history of HTN,liver disease, migraine with auro or DVT, but does smok. Discussed that prior contraception failure and smoking would not make OCPs the best option. She wanted tubal ligation in the past after having two kids but says the OBGYN refused to do this. I think this is appropriate for her and will refer her to OBGYN for discussion about this and other options, as well as for pap smear for which she is due.

## 2022-05-28 NOTE — Progress Notes (Signed)
Internal Medicine Clinic Attending  Case discussed with Dr. Rogers  At the time of the visit.  We reviewed the resident's history and exam and pertinent patient test results.  I agree with the assessment, diagnosis, and plan of care documented in the resident's note.  

## 2022-06-23 ENCOUNTER — Ambulatory Visit
Admission: EM | Admit: 2022-06-23 | Discharge: 2022-06-23 | Disposition: A | Discharge status: Home / Self Care | Payer: Medicaid Other | Attending: Urgent Care | Admitting: Urgent Care

## 2022-06-23 ENCOUNTER — Other Ambulatory Visit (HOSPITAL_COMMUNITY): Payer: Self-pay | Admitting: Physician Assistant

## 2022-06-23 DIAGNOSIS — K047 Periapical abscess without sinus: Secondary | ICD-10-CM | POA: Diagnosis not present

## 2022-06-23 MED ORDER — NAPROXEN 500 MG PO TABS: 500.0000 mg | ORAL_TABLET | Freq: Two times a day (BID) | ORAL | 0 refills | Status: DC

## 2022-06-23 MED ORDER — CLINDAMYCIN HCL 300 MG PO CAPS: 300.0000 mg | ORAL_CAPSULE | Freq: Three times a day (TID) | ORAL | 0 refills | Status: DC

## 2022-06-23 NOTE — ED Triage Notes (Signed)
Pt c/o right upper dental pain-states she has dentist appt 12/28-states she was advised to come to [facility] to obtain abx-NAD-steady gait
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
## 2022-07-03 NOTE — L&D Delivery Note (Signed)
OB/GYN Faculty Practice Delivery Note  Lorraine Rogers is a 32 y.o. [id number] s/p SVD at [redacted]w[redacted]d. She was admitted for IOL for A1GDM.   ROM: 0h 47m with clear fluid GBS Status: negative   Maximum Maternal Temperature: 98.53F  Labor Progress: Initial SVE: 1.5/thick/ballotable. Dual cytotec. Foley balloon. She then progressed with strong urge to push.   Delivery Date/Time: 05/12/2023 @0053   Delivery: Called to room and patient was complete and pushing. Head delivered ROA. No nuchal cord present. Shoulder and body delivered in usual fashion. Infant with spontaneous cry, placed on mother's abdomen, dried and stimulated. Cord clamped x 2 after 1-minute delay, and cut by FOB. Cord blood drawn. Placenta delivered spontaneously with gentle cord traction. Fundus firm with massage and Pitocin. Labia, perineum, vagina, and cervix inspected without laceration.   Baby Weight: 3400g  Placenta: 3 vessel, intact. Sent to L&D Complications: None Lacerations: None EBL: 192 mL Analgesia: IV fentanyl  Infant:  APGAR (1 MIN):  9 APGAR (5 MINS):  9  Wyn Forster, MD OB Family Medicine Fellow, [facility] for [facility], [facility] 05/12/2023, 1:10 AM

## 2022-07-12 ENCOUNTER — Other Ambulatory Visit: Payer: Self-pay

## 2022-07-12 ENCOUNTER — Emergency Department (HOSPITAL_COMMUNITY)
Admission: EM | Admit: 2022-07-12 | Discharge: 2022-07-12 | Disposition: A | Discharge status: Home / Self Care | Payer: Medicaid Other | Attending: Emergency Medicine | Admitting: Emergency Medicine

## 2022-07-12 ENCOUNTER — Encounter (HOSPITAL_COMMUNITY): Payer: Self-pay

## 2022-07-12 ENCOUNTER — Emergency Department (HOSPITAL_COMMUNITY): Payer: Medicaid Other

## 2022-07-12 DIAGNOSIS — N390 Urinary tract infection, site not specified: Secondary | ICD-10-CM | POA: Diagnosis not present

## 2022-07-12 DIAGNOSIS — N83201 Unspecified ovarian cyst, right side: Secondary | ICD-10-CM | POA: Diagnosis not present

## 2022-07-12 DIAGNOSIS — F17210 Nicotine dependence, cigarettes, uncomplicated: Secondary | ICD-10-CM | POA: Diagnosis not present

## 2022-07-12 DIAGNOSIS — R109 Unspecified abdominal pain: Secondary | ICD-10-CM | POA: Diagnosis present

## 2022-07-12 LAB — CBC WITH DIFFERENTIAL/PLATELET
Abs Immature Granulocytes: 0.04 10*3/uL (ref 0.00–0.07)
Basophils Absolute: 0.1 10*3/uL (ref 0.0–0.1)
Basophils Relative: 0 %
Eosinophils Absolute: 0.6 10*3/uL — ABNORMAL HIGH (ref 0.0–0.5)
Eosinophils Relative: 4 %
HCT: 37.4 % (ref 36.0–46.0)
Hemoglobin: 12.4 g/dL (ref 12.0–15.0)
Immature Granulocytes: 0 %
Lymphocytes Relative: 34 %
Lymphs Abs: 4.4 10*3/uL — ABNORMAL HIGH (ref 0.7–4.0)
MCH: 31.2 pg (ref 26.0–34.0)
MCHC: 33.2 g/dL (ref 30.0–36.0)
MCV: 94 fL (ref 80.0–100.0)
Monocytes Absolute: 0.8 10*3/uL (ref 0.1–1.0)
Monocytes Relative: 6 %
Neutro Abs: 7.1 10*3/uL (ref 1.7–7.7)
Neutrophils Relative %: 56 %
Platelets: 270 10*3/uL (ref 150–400)
RBC: 3.98 MIL/uL (ref 3.87–5.11)
RDW: 13.3 % (ref 11.5–15.5)
WBC: 13 10*3/uL — ABNORMAL HIGH (ref 4.0–10.5)
nRBC: 0 % (ref 0.0–0.2)

## 2022-07-12 LAB — BASIC METABOLIC PANEL
Anion gap: 9 (ref 5–15)
BUN: 12 mg/dL (ref 6–20)
CO2: 20 mmol/L — ABNORMAL LOW (ref 22–32)
Calcium: 8.7 mg/dL — ABNORMAL LOW (ref 8.9–10.3)
Chloride: 106 mmol/L (ref 98–111)
Creatinine, Ser: 0.78 mg/dL (ref 0.44–1.00)
GFR, Estimated: >60 mL/min (ref >60)
Glucose, Bld: 107 mg/dL — ABNORMAL HIGH (ref 70–99)
Potassium: 3.7 mmol/L (ref 3.5–5.1)
Sodium: 135 mmol/L (ref 135–145)

## 2022-07-12 LAB — URINALYSIS, ROUTINE W REFLEX MICROSCOPIC
Bilirubin Urine: NEGATIVE
Glucose, UA: NEGATIVE mg/dL
Hgb urine dipstick: NEGATIVE
Ketones, ur: NEGATIVE mg/dL
Nitrite: NEGATIVE
Protein, ur: NEGATIVE mg/dL
Specific Gravity, Urine: 1.021 (ref 1.005–1.030)
WBC, UA: >50 WBC/hpf — ABNORMAL HIGH (ref 0–5)
pH: 6 (ref 5.0–8.0)

## 2022-07-12 MED ORDER — HYDROCODONE-ACETAMINOPHEN 5-325 MG PO TABS: 2.0000 | ORAL_TABLET | Freq: Four times a day (QID) | ORAL | 0 refills | Status: DC | PRN

## 2022-07-12 MED ORDER — HYDROMORPHONE HCL 1 MG/ML IJ SOLN
1.0000 mg | Freq: Once | INTRAMUSCULAR | Status: AC
  Administered 2022-07-12: 1 mg via INTRAVENOUS
  Filled 2022-07-12: qty 1

## 2022-07-12 MED ORDER — SODIUM CHLORIDE 0.9 % IV SOLN
1.0000 g | Freq: Once | INTRAVENOUS | Status: AC
  Administered 2022-07-12: 1 g via INTRAVENOUS
  Filled 2022-07-12: qty 10

## 2022-07-12 MED ORDER — CEPHALEXIN 500 MG PO CAPS: 500.0000 mg | ORAL_CAPSULE | Freq: Two times a day (BID) | ORAL | 0 refills | Status: DC

## 2022-07-12 MED ORDER — ONDANSETRON HCL 4 MG/2ML IJ SOLN
4.0000 mg | Freq: Once | INTRAMUSCULAR | Status: AC
  Administered 2022-07-12: 4 mg via INTRAVENOUS
  Filled 2022-07-12: qty 2

## 2022-07-12 NOTE — ED Notes (Signed)
Pt unable to give urine sample
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
## 2022-07-12 NOTE — ED Triage Notes (Signed)
Pt began having excruciating lower abd pain at 0330 this morning. Also some nausea. Denies diarrhea, vomiting. Pt should start cycle in two days but has never had cramps like this.

## 2022-07-12 NOTE — Discharge Instructions (Signed)
Follow-up with your OB/GYN in about 6 to 12 weeks to have your ovarian cyst reevaluated.

## 2022-07-13 LAB — URINE CULTURE: Culture: 10000 — AB

## 2022-08-16 ENCOUNTER — Emergency Department (HOSPITAL_COMMUNITY)
Admission: EM | Admit: 2022-08-16 | Discharge: 2022-08-17 | Disposition: A | Discharge status: Home / Self Care | Payer: Medicaid Other | Attending: Emergency Medicine | Admitting: Emergency Medicine

## 2022-08-16 DIAGNOSIS — R112 Nausea with vomiting, unspecified: Secondary | ICD-10-CM | POA: Insufficient documentation

## 2022-08-16 DIAGNOSIS — R109 Unspecified abdominal pain: Secondary | ICD-10-CM | POA: Diagnosis present

## 2022-08-16 DIAGNOSIS — R1032 Left lower quadrant pain: Secondary | ICD-10-CM | POA: Diagnosis not present

## 2022-08-16 NOTE — ED Triage Notes (Signed)
Pt arrived to triage complaining of left sided abdominal pain and nausea that started this morning.   Pt states she has vomited 2-3 times

## 2022-08-17 ENCOUNTER — Other Ambulatory Visit: Payer: Self-pay

## 2022-08-17 ENCOUNTER — Emergency Department (HOSPITAL_COMMUNITY): Payer: Medicaid Other

## 2022-08-17 ENCOUNTER — Encounter (HOSPITAL_COMMUNITY): Payer: Self-pay

## 2022-08-17 LAB — URINALYSIS, ROUTINE W REFLEX MICROSCOPIC
Bilirubin Urine: NEGATIVE
Glucose, UA: NEGATIVE mg/dL
Hgb urine dipstick: NEGATIVE
Ketones, ur: NEGATIVE mg/dL
Nitrite: NEGATIVE
Protein, ur: NEGATIVE mg/dL
Specific Gravity, Urine: 1.028 (ref 1.005–1.030)
pH: 6 (ref 5.0–8.0)

## 2022-08-17 LAB — COMPREHENSIVE METABOLIC PANEL
ALT: 17 U/L (ref 0–44)
AST: 20 U/L (ref 15–41)
Albumin: 3.9 g/dL (ref 3.5–5.0)
Alkaline Phosphatase: 46 U/L (ref 38–126)
Anion gap: 8 (ref 5–15)
BUN: 11 mg/dL (ref 6–20)
CO2: 21 mmol/L — ABNORMAL LOW (ref 22–32)
Calcium: 9.1 mg/dL (ref 8.9–10.3)
Chloride: 108 mmol/L (ref 98–111)
Creatinine, Ser: 0.89 mg/dL (ref 0.44–1.00)
GFR, Estimated: >60 mL/min (ref >60)
Glucose, Bld: 109 mg/dL — ABNORMAL HIGH (ref 70–99)
Potassium: 3.3 mmol/L — ABNORMAL LOW (ref 3.5–5.1)
Sodium: 137 mmol/L (ref 135–145)
Total Bilirubin: 0.9 mg/dL (ref 0.3–1.2)
Total Protein: 7.4 g/dL (ref 6.5–8.1)

## 2022-08-17 LAB — PREGNANCY, URINE: Preg Test, Ur: NEGATIVE

## 2022-08-17 LAB — CBC
HCT: 39.9 % (ref 36.0–46.0)
Hemoglobin: 13.5 g/dL (ref 12.0–15.0)
MCH: 31.3 pg (ref 26.0–34.0)
MCHC: 33.8 g/dL (ref 30.0–36.0)
MCV: 92.6 fL (ref 80.0–100.0)
Platelets: 282 10*3/uL (ref 150–400)
RBC: 4.31 MIL/uL (ref 3.87–5.11)
RDW: 13 % (ref 11.5–15.5)
WBC: 13.3 10*3/uL — ABNORMAL HIGH (ref 4.0–10.5)
nRBC: 0 % (ref 0.0–0.2)

## 2022-08-17 LAB — LIPASE, BLOOD: Lipase: 42 U/L (ref 11–51)

## 2022-08-17 MED ORDER — FENTANYL CITRATE PF 50 MCG/ML IJ SOSY
50.0000 ug | PREFILLED_SYRINGE | Freq: Once | INTRAMUSCULAR | Status: AC
  Administered 2022-08-17: 50 ug via INTRAVENOUS
  Filled 2022-08-17: qty 1

## 2022-08-17 MED ORDER — ONDANSETRON HCL 4 MG/2ML IJ SOLN
4.0000 mg | Freq: Once | INTRAMUSCULAR | Status: AC
  Administered 2022-08-17: 4 mg via INTRAVENOUS
  Filled 2022-08-17: qty 2

## 2022-08-17 MED ORDER — IOHEXOL 300 MG/ML  SOLN
100.0000 mL | Freq: Once | INTRAMUSCULAR | Status: AC | PRN
  Administered 2022-08-17: 100 mL via INTRAVENOUS

## 2022-08-17 MED ORDER — SODIUM CHLORIDE 0.9 % IV BOLUS
1000.0000 mL | Freq: Once | INTRAVENOUS | Status: AC
  Administered 2022-08-17: 1000 mL via INTRAVENOUS

## 2022-08-17 MED ORDER — ONDANSETRON 4 MG PO TBDP: 4.0000 mg | ORAL_TABLET | Freq: Three times a day (TID) | ORAL | 0 refills | Status: DC | PRN

## 2022-08-17 MED ORDER — CEPHALEXIN 500 MG PO CAPS: 500.0000 mg | ORAL_CAPSULE | Freq: Three times a day (TID) | ORAL | 0 refills | Status: DC

## 2022-08-17 MED ORDER — ONDANSETRON HCL 4 MG/2ML IJ SOLN
4.0000 mg | Freq: Once | INTRAMUSCULAR | Status: AC | PRN
  Administered 2022-08-17: 4 mg via INTRAVENOUS
  Filled 2022-08-17: qty 2

## 2022-08-17 MED ORDER — SODIUM CHLORIDE (PF) 0.9 % IJ SOLN
INTRAMUSCULAR | Status: AC
  Filled 2022-08-17: qty 50

## 2022-08-17 MED ORDER — ONDANSETRON 4 MG PO TBDP
4.0000 mg | ORAL_TABLET | Freq: Once | ORAL | Status: DC | PRN
  Filled 2022-08-17: qty 1

## 2022-08-17 NOTE — ED Provider Notes (Signed)
[place] EMERGENCY DEPARTMENT AT [facility] Provider Note   CSN: [id number] Arrival date & time: 08/16/22  2352     History  Chief Complaint  Patient presents with   Abdominal Pain    Lorraine Rogers is a 32 y.o. female.  Left-sided abdominal pain since this morning associate with nausea and vomiting.  Pain comes and goes lasting for several minutes to hours at a time.  No improvement with Tylenol or hot bath.  2-3 episodes of vomiting.  No pain with urination or blood in the urine.  No vaginal bleeding or discharge.  No chest pain or shortness of breath.  She states she was here 2 months ago and diagnosed with a an ovarian cyst but it appears this was on the right side based on ultrasound.  She still has appendix and gallbladder.  No history of kidney stones.  No back pain.  The pain has not moved since it for started.  The history is provided by the patient.  Abdominal Pain Associated symptoms: nausea and vomiting   Associated symptoms: no chest pain, no cough, no dysuria, no fever, no hematuria and no shortness of breath        Home Medications Prior to Admission medications   Medication Sig Start Date End Date Taking? Authorizing Provider  cephALEXin (KEFLEX) 500 MG capsule Take 1 capsule (500 mg total) by mouth 2 (two) times daily. 07/12/22   Molpus, John, MD  FLUoxetine (PROZAC) 20 MG capsule Take 2 capsules (40 mg total) by mouth daily. 05/16/22 07/15/22  Iona Coach, MD  HYDROcodone-acetaminophen (NORCO) 5-325 MG tablet Take 2 tablets by mouth every 6 (six) hours as needed for severe pain. 07/12/22   Molpus, John, MD  hydrOXYzine (ATARAX) 25 MG tablet Take 1 tablet (25 mg total) by mouth 3 (three) times daily as needed for anxiety. 04/21/22   Nwoko, Uchenna E, PA  melatonin 5 MG TABS Take 1 tablet (5 mg total) by mouth at bedtime as needed. 04/21/22   Nwoko, Terese Door, PA  naproxen (NAPROSYN) 500 MG tablet Take 1 tablet (500 mg total) by mouth 2 (two) times daily  with a meal. 06/23/22   Jaynee Eagles, PA-C  nicotine polacrilex (NICORETTE) 4 MG gum Take 1 each (4 mg total) by mouth as needed for smoking cessation. 04/21/22   Nwoko, Terese Door, PA  traZODone (DESYREL) 50 MG tablet Take 1 tablet (50 mg total) by mouth at bedtime as needed for sleep. 04/21/22   Nwoko, Terese Door, PA      Allergies    Penicillins and Sulfa antibiotics    Review of Systems   Review of Systems  Constitutional:  Negative for activity change, appetite change and fever.  HENT:  Negative for congestion and rhinorrhea.   Respiratory:  Negative for cough, chest tightness and shortness of breath.   Cardiovascular:  Negative for chest pain.  Gastrointestinal:  Positive for abdominal pain, nausea and vomiting.  Genitourinary:  Negative for dysuria and hematuria.  Musculoskeletal:  Negative for arthralgias, back pain and myalgias.  Skin:  Negative for rash.  Neurological:  Negative for weakness and headaches.   all other systems are negative except as noted in the HPI and PMH.    Physical Exam Updated Vital Signs BP 114/65   Pulse (!) 53   Temp 98.3 F (36.8 C) (Oral)   Resp 16   Ht 5' 7"$  (1.702 m)   Wt 113.4 kg   SpO2 100%   BMI 39.16 kg/m  Physical Exam Vitals and nursing note reviewed.  Constitutional:      General: She is not in acute distress.    Appearance: She is well-developed.  HENT:     Head: Normocephalic and atraumatic.     Mouth/Throat:     Pharynx: No oropharyngeal exudate.  Eyes:     Conjunctiva/sclera: Conjunctivae normal.     Pupils: Pupils are equal, round, and reactive to light.  Neck:     Comments: No meningismus. Cardiovascular:     Rate and Rhythm: Normal rate and regular rhythm.     Heart sounds: Normal heart sounds. No murmur heard. Pulmonary:     Effort: Pulmonary effort is normal. No respiratory distress.     Breath sounds: Normal breath sounds.  Abdominal:     Palpations: Abdomen is soft.     Tenderness: There is abdominal  tenderness. There is no guarding or rebound.     Comments: Left lower quadrant tenderness, no guarding or rebound  Musculoskeletal:        General: No tenderness. Normal range of motion.     Cervical back: Normal range of motion and neck supple.     Comments: No CVAT  Skin:    General: Skin is warm.  Neurological:     Mental Status: She is alert and oriented to person, place, and time.     Cranial Nerves: No cranial nerve deficit.     Motor: No abnormal muscle tone.     Coordination: Coordination normal.     Comments:  5/5 strength throughout. CN 2-12 intact.Equal grip strength.   Psychiatric:        Behavior: Behavior normal.     ED Results / Procedures / Treatments   Labs (all labs ordered are listed, but only abnormal results are displayed) Labs Reviewed  COMPREHENSIVE METABOLIC PANEL - Abnormal; Notable for the following components:      Result Value   Potassium 3.3 (*)    CO2 21 (*)    Glucose, Bld 109 (*)    All other components within normal limits  CBC - Abnormal; Notable for the following components:   WBC 13.3 (*)    All other components within normal limits  URINALYSIS, ROUTINE W REFLEX MICROSCOPIC - Abnormal; Notable for the following components:   APPearance HAZY (*)    Leukocytes,Ua SMALL (*)    Bacteria, UA RARE (*)    All other components within normal limits  URINE CULTURE  LIPASE, BLOOD  PREGNANCY, URINE  I-STAT BETA HCG BLOOD, ED ([facility], [facility], [facility] ONLY)    EKG None  Radiology CT ABDOMEN PELVIS W CONTRAST  Result Date: 08/17/2022 CLINICAL DATA:  Evaluate for diverticulitis. Left abdominal pain and nausea for 1 day with elevated white blood cell count. EXAM: CT ABDOMEN AND PELVIS WITH CONTRAST TECHNIQUE: Multidetector CT imaging of the abdomen and pelvis was performed using the standard protocol following bolus administration of intravenous contrast. RADIATION DOSE REDUCTION: This exam was performed according to the departmental dose-optimization program  which includes automated exposure control, adjustment of the mA and/or kV according to patient size and/or use of iterative reconstruction technique. CONTRAST:  124m OMNIPAQUE IOHEXOL 300 MG/ML  SOLN COMPARISON:  None available FINDINGS: Lower chest: No pleural fluid or airspace consolidation. 6 mm nodule identified within the left lower lobe, image 33/4. Within the posterior right base there is a 6 mm nodule, image 45/4. Hepatobiliary: No focal liver abnormality is seen. No gallstones, gallbladder wall thickening, or biliary dilatation. Pancreas: Unremarkable. No  pancreatic ductal dilatation or surrounding inflammatory changes. Spleen: Normal in size without focal abnormality. Adrenals/Urinary Tract: Normal adrenal glands. No nephrolithiasis, hydronephrosis or mass. Urinary bladder is unremarkable. Stomach/Bowel: Stomach appears normal. The appendix is visualized and appears within normal limits measuring up to 6 mm. No periappendiceal soft tissue stranding or free fluid. Gas is noted within the lumen of the appendix. No pathologic dilatation of the large or small bowel loops. No bowel wall thickening, inflammation or distension. Vascular/Lymphatic: No signs of abdominopelvic adenopathy. Reproductive: Uterus appears normal.  No adnexal mass identified. Other: There is no free fluid or fluid collections identified. No signs of pneumoperitoneum. Musculoskeletal: IMPRESSION: 1. No acute findings within the abdomen or pelvis. 2. No signs of acute diverticulitis. 3. Small bilateral lower lobe lung nodules are identified measuring up to 6 mm. These are nonspecific and are favored to represent sequelae of inflammation or infection. Consensus criteria do not apply to patients in this age group. Consider follow-up imaging in 6-12 months to ensure resolution. Electronically Signed   By: Kerby Moors M.D.   On: 08/17/2022 06:27   US Pelvis Complete  Result Date: 08/17/2022 CLINICAL DATA:  Left lower quadrant pain EXAM:  TRANSABDOMINAL AND TRANSVAGINAL ULTRASOUND OF PELVIS DOPPLER ULTRASOUND OF OVARIES TECHNIQUE: Both transabdominal and transvaginal ultrasound examinations of the pelvis were performed. Transabdominal technique was performed for global imaging of the pelvis including uterus, ovaries, adnexal regions, and pelvic cul-de-sac. It was necessary to proceed with endovaginal exam following the transabdominal exam to visualize the ovaries. Color and duplex Doppler ultrasound was utilized to evaluate blood flow to the ovaries. COMPARISON:  07/12/2022 FINDINGS: Uterus Measurements: 10 x 5 x 6 cm = volume: 180 mL. No fibroids or other mass visualized. Endometrium Thickness: 12 mm.  No focal abnormality visualized. Right ovary Measurements: 31 x 19 x 22 mm = volume: 7 mL. Normal appearance/no adnexal mass. Left ovary Measurements: 31 x 19 x 28 mm = volume: 9 mL. Normal appearance/no adnexal mass. Pulsed Doppler evaluation of both ovaries demonstrates normal low-resistance arterial and venous waveforms. Other findings No abnormal free fluid. IMPRESSION: Normal pelvic ultrasound. Electronically Signed   By: Jorje Guild M.D.   On: 08/17/2022 04:42   US Transvaginal Non-OB  Result Date: 08/17/2022 CLINICAL DATA:  Left lower quadrant pain EXAM: TRANSABDOMINAL AND TRANSVAGINAL ULTRASOUND OF PELVIS DOPPLER ULTRASOUND OF OVARIES TECHNIQUE: Both transabdominal and transvaginal ultrasound examinations of the pelvis were performed. Transabdominal technique was performed for global imaging of the pelvis including uterus, ovaries, adnexal regions, and pelvic cul-de-sac. It was necessary to proceed with endovaginal exam following the transabdominal exam to visualize the ovaries. Color and duplex Doppler ultrasound was utilized to evaluate blood flow to the ovaries. COMPARISON:  07/12/2022 FINDINGS: Uterus Measurements: 10 x 5 x 6 cm = volume: 180 mL. No fibroids or other mass visualized. Endometrium Thickness: 12 mm.  No focal  abnormality visualized. Right ovary Measurements: 31 x 19 x 22 mm = volume: 7 mL. Normal appearance/no adnexal mass. Left ovary Measurements: 31 x 19 x 28 mm = volume: 9 mL. Normal appearance/no adnexal mass. Pulsed Doppler evaluation of both ovaries demonstrates normal low-resistance arterial and venous waveforms. Other findings No abnormal free fluid. IMPRESSION: Normal pelvic ultrasound. Electronically Signed   By: Jorje Guild M.D.   On: 08/17/2022 04:42   [place] Art/Ven Flow Abd Pelv Doppler  Result Date: 08/17/2022 CLINICAL DATA:  Left lower quadrant pain EXAM: TRANSABDOMINAL AND TRANSVAGINAL ULTRASOUND OF PELVIS DOPPLER ULTRASOUND OF OVARIES TECHNIQUE: Both transabdominal  and transvaginal ultrasound examinations of the pelvis were performed. Transabdominal technique was performed for global imaging of the pelvis including uterus, ovaries, adnexal regions, and pelvic cul-de-sac. It was necessary to proceed with endovaginal exam following the transabdominal exam to visualize the ovaries. Color and duplex Doppler ultrasound was utilized to evaluate blood flow to the ovaries. COMPARISON:  07/12/2022 FINDINGS: Uterus Measurements: 10 x 5 x 6 cm = volume: 180 mL. No fibroids or other mass visualized. Endometrium Thickness: 12 mm.  No focal abnormality visualized. Right ovary Measurements: 31 x 19 x 22 mm = volume: 7 mL. Normal appearance/no adnexal mass. Left ovary Measurements: 31 x 19 x 28 mm = volume: 9 mL. Normal appearance/no adnexal mass. Pulsed Doppler evaluation of both ovaries demonstrates normal low-resistance arterial and venous waveforms. Other findings No abnormal free fluid. IMPRESSION: Normal pelvic ultrasound. Electronically Signed   By: Jorje Guild M.D.   On: 08/17/2022 04:42    Procedures Procedures    Medications Ordered in ED Medications  fentaNYL (SUBLIMAZE) injection 50 mcg (has no administration in time range)  ondansetron (ZOFRAN) injection 4 mg (has no administration in time  range)  sodium chloride 0.9 % bolus 1,000 mL (has no administration in time range)  ondansetron (ZOFRAN) injection 4 mg (4 mg Intravenous Given 08/17/22 0023)    ED Course/ Medical Decision Making/ A&P                             Medical Decision Making Amount and/or Complexity of Data Reviewed Labs: ordered. Decision-making details documented in ED Course. Radiology: ordered and independent interpretation performed. Decision-making details documented in ED Course. ECG/medicine tests: ordered and independent interpretation performed. Decision-making details documented in ED Course.  Risk Prescription drug management.   Left lower quadrant abdominal pain.  Vital stable, no distress.  HCG negative.  Labs show pyuria as well as leukocyte esterase.  Will send for urine culture  Workup is reassuring.  Pelvic ultrasound shows no evidence ovarian torsion or ovarian cyst.  CT scan does not show any evidence of kidney stone or diverticulitis.  No acute surgical pathology. Patient informed of lung nodules and need for follow-up CT scan in 6 months to ensure stability.  Previous ovarian cyst has resolved. Consider possibly passed kidney stone.  May also have UTI.  No vomiting in the ED.  She is tolerating p.o.  Her pain has resolved.  Abdomen soft on recheck.  Will treat possible UTI.  Return to the ED with new or worsening symptoms.        Final Clinical Impression(s) / ED Diagnoses Final diagnoses:  LLQ pain    Rx / DC Orders ED Discharge Orders     None         Jolie Strohecker, Annie Main, MD 08/17/22 [phone]

## 2022-08-17 NOTE — Discharge Instructions (Signed)
As we discussed he may have passed a kidney stone.  Take the antibiotics as prescribed for a possible urinary tract infection.  Your CT scan did show some scarring in your lungs and he should have a repeat CT scan in 6 months to ensure this is stable.  Return to the ED with worsening pain, fever, vomiting or other concerns.

## 2022-08-17 NOTE — ED Notes (Signed)
Lab added on Urine pregnancy and urine culture

## 2022-08-18 LAB — URINE CULTURE

## 2022-09-15 ENCOUNTER — Telehealth: Payer: Self-pay | Admitting: General Practice

## 2022-09-15 ENCOUNTER — Other Ambulatory Visit: Payer: Self-pay | Admitting: Internal Medicine

## 2022-09-15 NOTE — Telephone Encounter (Signed)
Sent to Dr Lisabeth Devoid by mistake. Sorry Dr Bevelyn Buckles.  I have sent this to Dr Raymondo Band.

## 2022-09-15 NOTE — Progress Notes (Signed)
Patient called in stating that she had a home pregnancy test that was positive.  She is wondering which medications are and are not safe to take during pregnancy.  Specifically, she takes Prozac and Atarax for her anxiety and is wondering about these.  I advised the patient that it is safe to take Prozac, but she should discontinue taking Atarax, as it can cause certain birth defects.

## 2022-09-15 NOTE — Telephone Encounter (Signed)
Pt requesting a call back. Pt states she took a home pregnancy test and it is positive.  She is concerned about taking her medications being pregnant and would like some advice on what should be her next steps.

## 2022-09-18 ENCOUNTER — Encounter: Payer: Self-pay | Admitting: *Deleted

## 2022-09-18 ENCOUNTER — Ambulatory Visit (INDEPENDENT_AMBULATORY_CARE_PROVIDER_SITE_OTHER): Payer: Medicaid Other | Admitting: *Deleted

## 2022-09-18 DIAGNOSIS — Z3201 Encounter for pregnancy test, result positive: Secondary | ICD-10-CM

## 2022-09-18 DIAGNOSIS — Z3200 Encounter for pregnancy test, result unknown: Secondary | ICD-10-CM

## 2022-09-18 LAB — POCT PREGNANCY, URINE: Preg Test, Ur: POSITIVE — AB

## 2022-09-18 NOTE — Progress Notes (Signed)
Lorraine Rogers dropped off a urine test for pregnancy. Test was positive. I called her and informed her of result. She confirms sure LMP of 08/18/22, states it could have been 08/19/22. This makes her [redacted]w[redacted]d with EDD 05/25/23. I advised her to start prenatal care with provider her choice. She plans to go here. I advised her to contact front desk for appointment. I also reviewed her meds and advise she start PNV. She has bought PNV and will start taking them. She is on Prozac and Atarax for depression since her Mother died about 7 months ago. She asked her provider and they told her to ask her ob doctor. I informed her I will review with provider and will send her a MyChart message whether she can continue or should stop. She voices understanding. Staci Acosta

## 2022-10-20 ENCOUNTER — Ambulatory Visit: Payer: Medicaid Other | Admitting: Certified Nurse Midwife

## 2022-11-01 ENCOUNTER — Telehealth: Payer: Self-pay | Admitting: Family Medicine

## 2022-11-01 NOTE — Telephone Encounter (Signed)
Received a message from Gala Murdoch H to see if another office can do patient intake appointment tomorrow if not we would have to reschedule.  I asked the other offices and they didn't have any open slots for tomorrow.  Before me rescheduling the appointment I spoke with [place] and she said based on the weeks of pregnancy ( today pt is 10 and 5 days)  we should reschedule both the intake and the new ob appointment. I called the patient with no answer so I sent her a mychart message.

## 2022-11-02 ENCOUNTER — Telehealth: Payer: Medicaid Other

## 2022-11-04 ENCOUNTER — Inpatient Hospital Stay (HOSPITAL_COMMUNITY)
Admission: AD | Admit: 2022-11-04 | Discharge: 2022-11-04 | Disposition: A | Discharge status: Home / Self Care | Payer: Medicaid Other | Attending: Obstetrics & Gynecology | Admitting: Obstetrics & Gynecology

## 2022-11-04 ENCOUNTER — Inpatient Hospital Stay (HOSPITAL_COMMUNITY): Payer: Medicaid Other

## 2022-11-04 DIAGNOSIS — A5901 Trichomonal vulvovaginitis: Secondary | ICD-10-CM | POA: Insufficient documentation

## 2022-11-04 DIAGNOSIS — O219 Vomiting of pregnancy, unspecified: Secondary | ICD-10-CM | POA: Diagnosis not present

## 2022-11-04 DIAGNOSIS — Z113 Encounter for screening for infections with a predominantly sexual mode of transmission: Secondary | ICD-10-CM | POA: Diagnosis not present

## 2022-11-04 DIAGNOSIS — O2342 Unspecified infection of urinary tract in pregnancy, second trimester: Secondary | ICD-10-CM

## 2022-11-04 DIAGNOSIS — O99891 Other specified diseases and conditions complicating pregnancy: Secondary | ICD-10-CM | POA: Diagnosis not present

## 2022-11-04 DIAGNOSIS — N390 Urinary tract infection, site not specified: Secondary | ICD-10-CM | POA: Insufficient documentation

## 2022-11-04 DIAGNOSIS — O26891 Other specified pregnancy related conditions, first trimester: Secondary | ICD-10-CM | POA: Insufficient documentation

## 2022-11-04 DIAGNOSIS — Z3491 Encounter for supervision of normal pregnancy, unspecified, first trimester: Secondary | ICD-10-CM

## 2022-11-04 DIAGNOSIS — O2341 Unspecified infection of urinary tract in pregnancy, first trimester: Secondary | ICD-10-CM | POA: Diagnosis not present

## 2022-11-04 DIAGNOSIS — Z3A11 11 weeks gestation of pregnancy: Secondary | ICD-10-CM | POA: Diagnosis not present

## 2022-11-04 DIAGNOSIS — O98311 Other infections with a predominantly sexual mode of transmission complicating pregnancy, first trimester: Secondary | ICD-10-CM | POA: Diagnosis not present

## 2022-11-04 DIAGNOSIS — O23591 Infection of other part of genital tract in pregnancy, first trimester: Secondary | ICD-10-CM | POA: Diagnosis not present

## 2022-11-04 DIAGNOSIS — A599 Trichomoniasis, unspecified: Secondary | ICD-10-CM | POA: Diagnosis not present

## 2022-11-04 DIAGNOSIS — O23592 Infection of other part of genital tract in pregnancy, second trimester: Secondary | ICD-10-CM | POA: Diagnosis not present

## 2022-11-04 HISTORY — DX: Unspecified infection of urinary tract in pregnancy, second trimester: O23.42

## 2022-11-04 LAB — WET PREP, GENITAL
Sperm: NONE SEEN
WBC, Wet Prep HPF POC: <10 (ref <10)
Yeast Wet Prep HPF POC: NONE SEEN

## 2022-11-04 LAB — URINALYSIS, ROUTINE W REFLEX MICROSCOPIC
Bilirubin Urine: NEGATIVE
Glucose, UA: NEGATIVE mg/dL
Hgb urine dipstick: NEGATIVE
Ketones, ur: NEGATIVE mg/dL
Nitrite: POSITIVE — AB
Protein, ur: NEGATIVE mg/dL
Specific Gravity, Urine: 1.016 (ref 1.005–1.030)
pH: 5 (ref 5.0–8.0)

## 2022-11-04 LAB — CBC
HCT: 37.8 % (ref 36.0–46.0)
Hemoglobin: 12.8 g/dL (ref 12.0–15.0)
MCH: 30.8 pg (ref 26.0–34.0)
MCHC: 33.9 g/dL (ref 30.0–36.0)
MCV: 90.9 fL (ref 80.0–100.0)
Platelets: 298 10*3/uL (ref 150–400)
RBC: 4.16 MIL/uL (ref 3.87–5.11)
RDW: 13.2 % (ref 11.5–15.5)
WBC: 14.2 10*3/uL — ABNORMAL HIGH (ref 4.0–10.5)
nRBC: 0 % (ref 0.0–0.2)

## 2022-11-04 LAB — HCG, QUANTITATIVE, PREGNANCY: hCG, Beta Chain, Quant, S: 86952 m[IU]/mL — ABNORMAL HIGH (ref <5)

## 2022-11-04 MED ORDER — ONDANSETRON 4 MG PO TBDP: 4.0000 mg | ORAL_TABLET | Freq: Three times a day (TID) | ORAL | 2 refills | Status: DC | PRN

## 2022-11-04 MED ORDER — METRONIDAZOLE 500 MG PO TABS
2000.0000 mg | ORAL_TABLET | Freq: Once | ORAL | Status: AC
  Administered 2022-11-04: 2000 mg via ORAL
  Filled 2022-11-04: qty 4

## 2022-11-04 MED ORDER — CEFADROXIL 500 MG PO CAPS: 500.0000 mg | ORAL_CAPSULE | Freq: Two times a day (BID) | ORAL | 0 refills | Status: DC

## 2022-11-04 NOTE — Discharge Instructions (Signed)
[facility]
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
## 2022-11-04 NOTE — MAU Note (Signed)
.  Lorraine Rogers is a 32 y.o. at [redacted]w[redacted]d here in MAU reporting: lower left ABD pain that radiates down to her pelvis and lower back pain since Weds am ongoing. Pt report taking Tylenol 650mg  every 5 hours for last two days, last dose @ 0245. Pt states she called the OB nurse on call and was told to come in for Ectopic eval. Pt denies VB.  Pt has not started [facility], pt reports scheduled appt changed to next Month.  Onset of complaint: ongoing since Weds Pain score: 5/10 ABD, 6/10 Back  Vitals:   11/04/22 0431  BP: 136/82  Pulse: 95  Resp: 18  Temp: 99 F (37.2 C)  SpO2: 100%     EAV:[id number] to assess, RN attempted viia Doppler Lab orders placed from triage:  UA

## 2022-11-06 LAB — GC/CHLAMYDIA PROBE AMP (~~LOC~~) NOT AT ARMC
Chlamydia: NEGATIVE
Comment: NEGATIVE
Comment: NORMAL
Neisseria Gonorrhea: NEGATIVE

## 2022-11-09 ENCOUNTER — Encounter: Payer: Medicaid Other | Admitting: Obstetrics and Gynecology

## 2022-11-10 ENCOUNTER — Other Ambulatory Visit: Payer: Self-pay | Admitting: Family Medicine

## 2022-11-10 DIAGNOSIS — O2341 Unspecified infection of urinary tract in pregnancy, first trimester: Secondary | ICD-10-CM

## 2022-11-10 MED ORDER — NITROFURANTOIN MONOHYD MACRO 100 MG PO CAPS
100.0000 mg | ORAL_CAPSULE | Freq: Two times a day (BID) | ORAL | 0 refills | Status: DC
Start: 2022-11-10 — End: 2022-12-18

## 2022-11-10 NOTE — Progress Notes (Signed)
Patient called clinic requesting alternative to cefadroxil due to it containing sulfur products. Rx sent for macrobid for recently diagnosed UTI.

## 2022-12-05 ENCOUNTER — Telehealth (INDEPENDENT_AMBULATORY_CARE_PROVIDER_SITE_OTHER): Payer: Medicaid Other

## 2022-12-05 DIAGNOSIS — A5901 Trichomonal vulvovaginitis: Secondary | ICD-10-CM

## 2022-12-05 DIAGNOSIS — O2341 Unspecified infection of urinary tract in pregnancy, first trimester: Secondary | ICD-10-CM

## 2022-12-05 DIAGNOSIS — Z3689 Encounter for other specified antenatal screening: Secondary | ICD-10-CM

## 2022-12-05 DIAGNOSIS — Z3480 Encounter for supervision of other normal pregnancy, unspecified trimester: Secondary | ICD-10-CM | POA: Insufficient documentation

## 2022-12-05 NOTE — Progress Notes (Signed)
Pt reported that she's having a lot of lower abdomen pressure & sometimes it hurts to walk.

## 2022-12-05 NOTE — Progress Notes (Signed)
New OB Intake  I connected with Lorraine Rogers  on 12/05/22 at  9:15 AM EDT by MyChart Video Visit and verified that I am speaking with the correct person using two identifiers. Nurse is located at [facility] and pt is located at home.  I discussed the limitations, risks, security and privacy concerns of performing an evaluation and management service by telephone and the availability of in person appointments. I also discussed with the patient that there may be a patient responsible charge related to this service. The patient expressed understanding and agreed to proceed.  I explained I am completing New OB Intake today. We discussed EDD of 05/25/23 that is based on LMP of 08/18/22. Pt is G5/P3. I reviewed her allergies, medications, Medical/Surgical/OB history, and appropriate screenings. I informed her of [facility] services. [facility] information placed in AVS. Based on history, this is a low risk pregnancy.  Patient Active Problem List   Diagnosis Date Noted   Supervision of other normal pregnancy, antepartum 12/05/2022   Urinary tract infection in mother during first trimester of pregnancy 11/04/2022   Trichomonal vaginitis during pregnancy in first trimester 11/04/2022   Tobacco use disorder 05/17/2022   MDD (major depressive disorder), severe (HCC) 04/17/2022   Borderline personality disorder in adolescent (HCC) 04/17/2022   Obesity (BMI 30-39.9) 03/20/2016   History of suicide attempt 01/24/2016    Concerns addressed today  Delivery Plans Plans to deliver at [facility]. Patient given information for [facility] Healthy [facility] website for more information about [facility]. Patient is not interested in water birth. Offered upcoming OB visit with CNM to discuss further.  MyChart/Babyscripts MyChart access verified. I explained pt will have some visits in office and some virtually. Babyscripts instructions given and order placed. Patient verifies receipt of registration text/e-mail. Account  successfully created and app downloaded.  Blood Pressure Cuff/Weight Scale Pt has own BP Cuff, Explained after first prenatal appt pt will check weekly and document in Babyscripts. Patient does have weight scale.  Anatomy US Explained first scheduled [place] will be around 19 weeks. Anatomy US scheduled for 01/03/23 at 1030a. Pt notified to arrive at 1015a.  Labs Discussed Avelina Laine genetic screening with patient. Would like both Panorama and Horizon drawn at new OB visit. Routine prenatal labs needed.  COVID Vaccine Patient has not had COVID vaccine.   Is patient a CenteringPregnancy candidate?  Not a Candidate  Not a candidate due to  MDD on Meds     Is patient a Mom+Baby Combined Care candidate?  Not a candidate    Social Determinants of Health Food Insecurity: Patient denies food insecurity. WIC Referral: Patient is interested in referral to [facility].  Transportation: Patient denies transportation needs. Childcare: Discussed no children allowed at ultrasound appointments. Offered childcare services; patient declines childcare services at this time.  Interested in [place]? If yes, send referral and doula dot phrase.   First visit review I reviewed new OB appt with patient. Explained pt will be seen by Dr.Pickens at first visit; encounter routed to appropriate provider. Explained that patient will be seen by pregnancy navigator following visit with provider.   Lorraine Rogers, CMA 12/05/2022  9:39 AM

## 2022-12-11 ENCOUNTER — Encounter: Payer: Self-pay | Admitting: Obstetrics and Gynecology

## 2022-12-11 ENCOUNTER — Ambulatory Visit (INDEPENDENT_AMBULATORY_CARE_PROVIDER_SITE_OTHER): Payer: Medicaid Other | Admitting: Obstetrics and Gynecology

## 2022-12-11 ENCOUNTER — Other Ambulatory Visit (HOSPITAL_COMMUNITY)
Admission: RE | Admit: 2022-12-11 | Discharge: 2022-12-11 | Disposition: A | Discharge status: Home / Self Care | Payer: Medicaid Other | Source: Ambulatory Visit | Attending: Obstetrics and Gynecology | Admitting: Obstetrics and Gynecology

## 2022-12-11 VITALS — BP 112/79 | HR 76 | Wt 256.3 lb

## 2022-12-11 DIAGNOSIS — E669 Obesity, unspecified: Secondary | ICD-10-CM

## 2022-12-11 DIAGNOSIS — F419 Anxiety disorder, unspecified: Secondary | ICD-10-CM

## 2022-12-11 DIAGNOSIS — O0992 Supervision of high risk pregnancy, unspecified, second trimester: Secondary | ICD-10-CM

## 2022-12-11 DIAGNOSIS — Z8619 Personal history of other infectious and parasitic diseases: Secondary | ICD-10-CM

## 2022-12-11 DIAGNOSIS — Z6841 Body Mass Index (BMI) 40.0 and over, adult: Secondary | ICD-10-CM

## 2022-12-11 DIAGNOSIS — Z3A16 16 weeks gestation of pregnancy: Secondary | ICD-10-CM | POA: Insufficient documentation

## 2022-12-11 DIAGNOSIS — O23592 Infection of other part of genital tract in pregnancy, second trimester: Secondary | ICD-10-CM

## 2022-12-11 DIAGNOSIS — A5901 Trichomonal vulvovaginitis: Secondary | ICD-10-CM

## 2022-12-11 DIAGNOSIS — O2341 Unspecified infection of urinary tract in pregnancy, first trimester: Secondary | ICD-10-CM

## 2022-12-11 DIAGNOSIS — F32A Depression, unspecified: Secondary | ICD-10-CM

## 2022-12-11 DIAGNOSIS — O2342 Unspecified infection of urinary tract in pregnancy, second trimester: Secondary | ICD-10-CM

## 2022-12-11 DIAGNOSIS — O99212 Obesity complicating pregnancy, second trimester: Secondary | ICD-10-CM

## 2022-12-11 DIAGNOSIS — O0932 Supervision of pregnancy with insufficient antenatal care, second trimester: Secondary | ICD-10-CM

## 2022-12-11 DIAGNOSIS — O99210 Obesity complicating pregnancy, unspecified trimester: Secondary | ICD-10-CM

## 2022-12-11 DIAGNOSIS — O23591 Infection of other part of genital tract in pregnancy, first trimester: Secondary | ICD-10-CM

## 2022-12-11 DIAGNOSIS — Z3480 Encounter for supervision of other normal pregnancy, unspecified trimester: Secondary | ICD-10-CM

## 2022-12-11 DIAGNOSIS — O0930 Supervision of pregnancy with insufficient antenatal care, unspecified trimester: Secondary | ICD-10-CM
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
## 2022-12-12 LAB — GC/CHLAMYDIA PROBE AMP (~~LOC~~) NOT AT ARMC
Chlamydia: NEGATIVE
Comment: NEGATIVE
Comment: NORMAL
Neisseria Gonorrhea: NEGATIVE

## 2022-12-13 LAB — COMPREHENSIVE METABOLIC PANEL
ALT: 22 IU/L (ref 0–32)
AST: 21 IU/L (ref 0–40)
Albumin/Globulin Ratio: 1.8
Albumin: 4 g/dL (ref 4.0–5.0)
Alkaline Phosphatase: 51 IU/L (ref 44–121)
BUN/Creatinine Ratio: 11 (ref 9–23)
BUN: 6 mg/dL (ref 6–20)
Bilirubin Total: 0.3 mg/dL (ref 0.0–1.2)
CO2: 19 mmol/L — ABNORMAL LOW (ref 20–29)
Calcium: 9.2 mg/dL (ref 8.7–10.2)
Chloride: 106 mmol/L (ref 96–106)
Creatinine, Ser: 0.55 mg/dL — ABNORMAL LOW (ref 0.57–1.00)
Globulin, Total: 2.2 g/dL (ref 1.5–4.5)
Glucose: 90 mg/dL (ref 70–99)
Potassium: 4.2 mmol/L (ref 3.5–5.2)
Sodium: 137 mmol/L (ref 134–144)
Total Protein: 6.2 g/dL (ref 6.0–8.5)
eGFR: 127 mL/min/{1.73_m2} (ref >59)

## 2022-12-13 LAB — CBC/D/PLT+RPR+RH+ABO+RUBIGG...
Antibody Screen: NEGATIVE
Basophils Absolute: 0 10*3/uL (ref 0.0–0.2)
Basos: 0 %
EOS (ABSOLUTE): 0.6 10*3/uL — ABNORMAL HIGH (ref 0.0–0.4)
Eos: 6 %
HCV Ab: NONREACTIVE
HIV Screen 4th Generation wRfx: NONREACTIVE
Hematocrit: 36.5 % (ref 34.0–46.6)
Hemoglobin: 12.1 g/dL (ref 11.1–15.9)
Hepatitis B Surface Ag: NEGATIVE
Immature Grans (Abs): 0 10*3/uL (ref 0.0–0.1)
Immature Granulocytes: 0 %
Lymphocytes Absolute: 2.6 10*3/uL (ref 0.7–3.1)
Lymphs: 26 %
MCH: 30.2 pg (ref 26.6–33.0)
MCHC: 33.2 g/dL (ref 31.5–35.7)
MCV: 91 fL (ref 79–97)
Monocytes Absolute: 0.8 10*3/uL (ref 0.1–0.9)
Monocytes: 8 %
Neutrophils Absolute: 6.1 10*3/uL (ref 1.4–7.0)
Neutrophils: 60 %
Platelets: 251 10*3/uL (ref 150–450)
RBC: 4.01 x10E6/uL (ref 3.77–5.28)
RDW: 12.8 % (ref 11.7–15.4)
RPR Ser Ql: NONREACTIVE
Rh Factor: POSITIVE
Rubella Antibodies, IGG: 1.73 index (ref >0.99)
WBC: 10.1 10*3/uL (ref 3.4–10.8)

## 2022-12-13 LAB — AFP, SERUM, OPEN SPINA BIFIDA
AFP MoM: 1.22
AFP Value: 32.9 ng/mL
Gest. Age on Collection Date: 16 weeks
Maternal Age At EDD: 30 yr
OSBR Risk 1 IN: 10000
Test Results:: NEGATIVE
Weight: 256 [lb_av]

## 2022-12-13 LAB — HCV INTERPRETATION

## 2022-12-13 LAB — PROTEIN / CREATININE RATIO, URINE
Creatinine, Urine: 185 mg/dL
Protein, Ur: 15.6 mg/dL
Protein/Creat Ratio: 84 mg/g creat (ref 0–200)

## 2022-12-13 LAB — TSH RFX ON ABNORMAL TO FREE T4: TSH: 2.54 u[IU]/mL (ref 0.450–4.500)

## 2022-12-13 LAB — HEMOGLOBIN A1C
Est. average glucose Bld gHb Est-mCnc: 108 mg/dL
Hgb A1c MFr Bld: 5.4 % (ref 4.8–5.6)

## 2022-12-14 LAB — CYTOLOGY - PAP: Diagnosis: NEGATIVE

## 2022-12-14 LAB — URINE CULTURE, OB REFLEX

## 2022-12-14 LAB — CULTURE, OB URINE

## 2022-12-14 MED ORDER — METRONIDAZOLE 500 MG PO TABS
500.0000 mg | ORAL_TABLET | Freq: Two times a day (BID) | ORAL | 0 refills | Status: AC
Start: 2022-12-14 — End: 2022-12-21

## 2022-12-14 NOTE — Addendum Note (Signed)
Addended by: Posen Bing on: 12/14/2022 01:13 PM   Modules accepted: Orders

## 2022-12-18 MED ORDER — NITROFURANTOIN MONOHYD MACRO 100 MG PO CAPS
100.0000 mg | ORAL_CAPSULE | Freq: Two times a day (BID) | ORAL | 0 refills | Status: AC
Start: 2022-12-18 — End: 2022-12-25

## 2022-12-18 NOTE — Addendum Note (Signed)
Addended by:  Bing on: 12/18/2022 12:07 PM   Modules accepted: Orders

## 2022-12-20 LAB — PANORAMA PRENATAL TEST FULL PANEL:PANORAMA TEST PLUS 5 ADDITIONAL MICRODELETIONS: FETAL FRACTION: 8.5

## 2022-12-29 ENCOUNTER — Encounter: Payer: Self-pay | Admitting: *Deleted

## 2023-01-03 ENCOUNTER — Other Ambulatory Visit: Payer: Self-pay | Admitting: *Deleted

## 2023-01-03 ENCOUNTER — Other Ambulatory Visit: Payer: Self-pay

## 2023-01-03 ENCOUNTER — Ambulatory Visit: Payer: MEDICAID | Admitting: *Deleted

## 2023-01-03 ENCOUNTER — Ambulatory Visit: Payer: MEDICAID | Attending: Obstetrics and Gynecology

## 2023-01-03 ENCOUNTER — Ambulatory Visit (INDEPENDENT_AMBULATORY_CARE_PROVIDER_SITE_OTHER): Payer: Medicaid Other | Admitting: Obstetrics & Gynecology

## 2023-01-03 VITALS — BP 110/64 | HR 85 | Wt 254.5 lb

## 2023-01-03 VITALS — BP 114/59 | HR 73

## 2023-01-03 DIAGNOSIS — O99210 Obesity complicating pregnancy, unspecified trimester: Secondary | ICD-10-CM

## 2023-01-03 DIAGNOSIS — O99212 Obesity complicating pregnancy, second trimester: Secondary | ICD-10-CM

## 2023-01-03 DIAGNOSIS — Z363 Encounter for antenatal screening for malformations: Secondary | ICD-10-CM | POA: Diagnosis not present

## 2023-01-03 DIAGNOSIS — Z3480 Encounter for supervision of other normal pregnancy, unspecified trimester: Secondary | ICD-10-CM | POA: Diagnosis not present

## 2023-01-03 DIAGNOSIS — Z3A19 19 weeks gestation of pregnancy: Secondary | ICD-10-CM | POA: Insufficient documentation

## 2023-01-03 DIAGNOSIS — Z6841 Body Mass Index (BMI) 40.0 and over, adult: Secondary | ICD-10-CM

## 2023-01-03 DIAGNOSIS — F322 Major depressive disorder, single episode, severe without psychotic features: Secondary | ICD-10-CM

## 2023-01-03 DIAGNOSIS — O99332 Smoking (tobacco) complicating pregnancy, second trimester: Secondary | ICD-10-CM

## 2023-01-03 NOTE — Progress Notes (Signed)
   PRENATAL VISIT NOTE  Subjective:  Lorraine Rogers is a 32 y.o. [id number] at [redacted]w[redacted]d being seen today for ongoing prenatal care.  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  Patient reports no complaints.  Contractions: Not present. Vag. Bleeding: None.  Movement: Present. Denies leaking of fluid.   The following portions of the patient's history were reviewed and updated as appropriate: allergies, current medications, past family history, past medical history, past social history, past surgical history and problem list.   Objective:   Vitals:   01/03/23 0906  BP: 110/64  Pulse: 85  Weight: 254 lb 8 oz (115.4 kg)    Fetal Status: Fetal Heart Rate (bpm): 154   Movement: Present     General:  Alert, oriented and cooperative. Patient is in no acute distress.  Skin: Skin is warm and dry. No rash noted.   Cardiovascular: Normal heart rate noted  Respiratory: Normal respiratory effort, no problems with respiration noted  Abdomen: Soft, gravid, appropriate for gestational age.  Pain/Pressure: Present (pelvic ressure while walking)     Pelvic: Cervical exam deferred        Extremities: Normal range of motion.  Edema: Trace  Mental Status: Normal mood and affect. Normal behavior. Normal judgment and thought content.   Assessment and Plan:  Pregnancy: [id number] at [redacted]w[redacted]d 1. Supervision of other normal pregnancy, antepartum [place] today  2. Obesity in pregnancy   3. MDD (major depressive disorder), severe (HCC)   4. BMI 40.0-44.9, adult (HCC)   Preterm labor symptoms and general obstetric precautions including but not  limited to vaginal bleeding, contractions, leaking of fluid and fetal movement were reviewed in detail with the patient. Please refer to After Visit Summary for other counseling recommendations.   Return in about 4 weeks (around 01/31/2023).  Future Appointments  Date Time Provider Department Center  01/03/2023 10:15 AM [facility] NURSE [facility]  01/03/2023 10:30 AM [facility] US3 [facility]  01/31/2023  8:15 AM Warden Fillers, MD [facility]    Scheryl Darter, MD

## 2023-01-08 ENCOUNTER — Telehealth: Payer: Self-pay | Admitting: *Deleted

## 2023-01-08 NOTE — Telephone Encounter (Signed)
Patient called in requesting a letter for work stating she cannot work due to mental health and pregnancy. States OB/GYN declined writing letter as we see her for depression. Patient was seen only one time at [facility] in November 2023. She is overdue for 6 month f/u. Transferred back to [employer] to schedule first available appt.

## 2023-01-31 ENCOUNTER — Ambulatory Visit: Payer: MEDICAID | Admitting: Obstetrics and Gynecology

## 2023-01-31 ENCOUNTER — Other Ambulatory Visit: Payer: Self-pay

## 2023-01-31 ENCOUNTER — Other Ambulatory Visit (HOSPITAL_COMMUNITY)
Admission: RE | Admit: 2023-01-31 | Discharge: 2023-01-31 | Disposition: A | Discharge status: Home / Self Care | Payer: MEDICAID | Source: Ambulatory Visit | Attending: Obstetrics and Gynecology | Admitting: Obstetrics and Gynecology

## 2023-01-31 VITALS — BP 116/61 | HR 71 | Wt 249.4 lb

## 2023-01-31 DIAGNOSIS — Z3A23 23 weeks gestation of pregnancy: Secondary | ICD-10-CM

## 2023-01-31 DIAGNOSIS — O23591 Infection of other part of genital tract in pregnancy, first trimester: Secondary | ICD-10-CM | POA: Insufficient documentation

## 2023-01-31 DIAGNOSIS — A5901 Trichomonal vulvovaginitis: Secondary | ICD-10-CM | POA: Insufficient documentation

## 2023-01-31 DIAGNOSIS — O99210 Obesity complicating pregnancy, unspecified trimester: Secondary | ICD-10-CM

## 2023-01-31 DIAGNOSIS — O23592 Infection of other part of genital tract in pregnancy, second trimester: Secondary | ICD-10-CM

## 2023-01-31 DIAGNOSIS — O99212 Obesity complicating pregnancy, second trimester: Secondary | ICD-10-CM

## 2023-01-31 DIAGNOSIS — Z3480 Encounter for supervision of other normal pregnancy, unspecified trimester: Secondary | ICD-10-CM

## 2023-01-31 DIAGNOSIS — Z6841 Body Mass Index (BMI) 40.0 and over, adult: Secondary | ICD-10-CM

## 2023-01-31 NOTE — Progress Notes (Signed)
   PRENATAL VISIT NOTE  Subjective:  Lorraine Rogers is a 32 y.o. G5P3013 at [redacted]w[redacted]d being seen today for ongoing prenatal care.  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  Patient doing well with no acute concerns today. She reports no complaints.  Contractions: Not present. Vag. Bleeding: None.  Movement: Present. Denies leaking of fluid.   The following portions of the patient's history were reviewed and updated as appropriate: allergies, current medications, past family history, past medical history, past social history, past surgical history and problem list. Problem list updated.  Objective:   Vitals:   01/31/23 0822  BP: 116/61  Pulse: 71  Weight: 249 lb 6.4 oz (113.1 kg)    Fetal Status: Fetal Heart Rate (bpm): 154 Fundal Height: 23 cm Movement: Present     General:  Alert, oriented and cooperative. Patient is in no acute distress.  Skin: Skin is warm and dry. No rash noted.   Cardiovascular: Normal heart rate noted  Respiratory: Normal respiratory effort, no problems with respiration noted  Abdomen: Soft, gravid, appropriate for gestational age.  Pain/Pressure: Present     Pelvic: Cervical exam deferred        Extremities: Normal range of motion.  Edema: Trace  Mental Status:  Normal mood and affect. Normal behavior. Normal judgment and thought content.   Assessment and Plan:  Pregnancy: [id number] at [redacted]w[redacted]d  1. Trichomonal vaginitis during pregnancy in first trimester TOC taken today - Cervicovaginal ancillary only( [place])  2. [redacted] weeks gestation of pregnancy   3. Supervision of other normal  pregnancy, antepartum Continue routine prenatal care  4. Obesity in pregnancy   5. BMI 40.0-44.9, adult (HCC)   Preterm labor symptoms and general obstetric precautions including but not limited to vaginal bleeding, contractions, leaking of fluid and fetal movement were reviewed in detail with the patient.  Please refer to After Visit Summary for other counseling recommendations.   Return in about 4 weeks (around 02/28/2023) for ROB, in person, 2 hr GTT, 3rd trim labs.   Mariel Aloe, MD Faculty Attending Center for [facility]

## 2023-02-14 ENCOUNTER — Ambulatory Visit: Payer: MEDICAID | Attending: Maternal & Fetal Medicine

## 2023-02-14 ENCOUNTER — Ambulatory Visit: Payer: MEDICAID | Admitting: *Deleted

## 2023-02-14 VITALS — BP 86/65 | HR 80

## 2023-02-14 DIAGNOSIS — Z3A25 25 weeks gestation of pregnancy: Secondary | ICD-10-CM | POA: Insufficient documentation

## 2023-02-14 DIAGNOSIS — O321XX0 Maternal care for breech presentation, not applicable or unspecified: Secondary | ICD-10-CM | POA: Insufficient documentation

## 2023-02-14 DIAGNOSIS — E669 Obesity, unspecified: Secondary | ICD-10-CM | POA: Diagnosis not present

## 2023-02-14 DIAGNOSIS — F17210 Nicotine dependence, cigarettes, uncomplicated: Secondary | ICD-10-CM | POA: Diagnosis not present

## 2023-02-14 DIAGNOSIS — O99332 Smoking (tobacco) complicating pregnancy, second trimester: Secondary | ICD-10-CM | POA: Diagnosis not present

## 2023-02-14 DIAGNOSIS — A5901 Trichomonal vulvovaginitis: Secondary | ICD-10-CM | POA: Insufficient documentation

## 2023-02-14 DIAGNOSIS — O99212 Obesity complicating pregnancy, second trimester: Secondary | ICD-10-CM | POA: Diagnosis not present

## 2023-02-14 DIAGNOSIS — Z362 Encounter for other antenatal screening follow-up: Secondary | ICD-10-CM | POA: Insufficient documentation

## 2023-02-14 DIAGNOSIS — Z3480 Encounter for supervision of other normal pregnancy, unspecified trimester: Secondary | ICD-10-CM | POA: Insufficient documentation

## 2023-02-14 DIAGNOSIS — O23591 Infection of other part of genital tract in pregnancy, first trimester: Secondary | ICD-10-CM | POA: Insufficient documentation

## 2023-02-28 ENCOUNTER — Other Ambulatory Visit: Payer: MEDICAID

## 2023-02-28 ENCOUNTER — Other Ambulatory Visit: Payer: Self-pay

## 2023-02-28 ENCOUNTER — Ambulatory Visit (INDEPENDENT_AMBULATORY_CARE_PROVIDER_SITE_OTHER): Payer: MEDICAID | Admitting: Obstetrics & Gynecology

## 2023-02-28 VITALS — BP 110/68 | HR 86 | Wt 250.5 lb

## 2023-02-28 DIAGNOSIS — Z3480 Encounter for supervision of other normal pregnancy, unspecified trimester: Secondary | ICD-10-CM

## 2023-02-28 DIAGNOSIS — Z23 Encounter for immunization: Secondary | ICD-10-CM

## 2023-02-28 DIAGNOSIS — O99210 Obesity complicating pregnancy, unspecified trimester: Secondary | ICD-10-CM

## 2023-02-28 DIAGNOSIS — Z3A27 27 weeks gestation of pregnancy: Secondary | ICD-10-CM

## 2023-02-28 DIAGNOSIS — O2342 Unspecified infection of urinary tract in pregnancy, second trimester: Secondary | ICD-10-CM

## 2023-02-28 DIAGNOSIS — O2341 Unspecified infection of urinary tract in pregnancy, first trimester: Secondary | ICD-10-CM

## 2023-02-28 DIAGNOSIS — F322 Major depressive disorder, single episode, severe without psychotic features: Secondary | ICD-10-CM

## 2023-02-28 DIAGNOSIS — O99212 Obesity complicating pregnancy, second trimester: Secondary | ICD-10-CM

## 2023-02-28 NOTE — Progress Notes (Signed)
   PRENATAL VISIT NOTE  Subjective:  Lorraine Rogers is a 32 y.o. G5P3013 at [redacted]w[redacted]d being seen today for ongoing prenatal care.  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  Patient reports no complaints.  Contractions: Irritability. Vag. Bleeding: None.  Movement: Present. Denies leaking of fluid.   The following portions of the patient's history were reviewed and updated as appropriate: allergies, current medications, past family history, past medical history, past social history, past surgical history and problem list.   Objective:   Vitals:   02/28/23 0920  BP: 110/68  Pulse: 86  Weight: 250 lb 8 oz (113.6 kg)    Fetal Status: Fetal Heart Rate (bpm): 132   Movement: Present     General:  Alert, oriented and cooperative. Patient is in no acute distress.  Skin: Skin is warm and dry. No rash noted.   Cardiovascular: Normal heart rate noted  Respiratory: Normal respiratory effort, no problems with respiration noted  Abdomen: Soft, gravid, appropriate for gestational age.  Pain/Pressure: Present     Pelvic: Cervical exam deferred        Extremities: Normal range of motion.  Edema: None  Mental Status: Normal mood and affect. Normal behavior. Normal judgment and thought content.   Assessment and Plan:  Pregnancy: [id number] at [redacted]w[redacted]d 1. Supervision of other normal pregnancy, antepartum [redacted]w[redacted]d  - RPR - HIV Antibody (routine testing w rflx) - Glucose Tolerance, 2 Hours w/1 Hour - CBC - Tdap vaccine greater than or equal to 7yo IM  2. Urinary tract infection in mother during first trimester of pregnancy Needs TOC   - Culture, OB Urine  3. Obesity in pregnancy Body mass index is 39.23 kg/m.   4. MDD (major depressive disorder), severe (HCC)   Preterm labor symptoms and general obstetric precautions including but not limited to vaginal bleeding, contractions, leaking of fluid and fetal movement were reviewed in detail with the patient. Please refer to After Visit Summary for other counseling recommendations.   No follow-ups on file.  Future Appointments  Date Time Provider Department Center  03/15/2023  3:15 PM [facility] NURSE [facility]  03/15/2023  3:30 PM [facility] US1 [facility]    Scheryl Darter, MD

## 2023-03-01 LAB — GLUCOSE TOLERANCE, 2 HOURS W/ 1HR
Glucose, 1 hour: 131 mg/dL (ref 70–179)
Glucose, 2 hour: 105 mg/dL (ref 70–152)
Glucose, Fasting: 122 mg/dL — ABNORMAL HIGH (ref 70–91)

## 2023-03-01 LAB — CBC
Hematocrit: 33.1 % — ABNORMAL LOW (ref 34.0–46.6)
Hemoglobin: 11.2 g/dL (ref 11.1–15.9)
MCH: 31.4 pg (ref 26.6–33.0)
MCHC: 33.8 g/dL (ref 31.5–35.7)
MCV: 93 fL (ref 79–97)
Platelets: 247 10*3/uL (ref 150–450)
RBC: 3.57 x10E6/uL — ABNORMAL LOW (ref 3.77–5.28)
RDW: 12.6 % (ref 11.7–15.4)
WBC: 11.9 10*3/uL — ABNORMAL HIGH (ref 3.4–10.8)

## 2023-03-01 LAB — RPR: RPR Ser Ql: NONREACTIVE

## 2023-03-01 LAB — HIV ANTIBODY (ROUTINE TESTING W REFLEX): HIV Screen 4th Generation wRfx: NONREACTIVE

## 2023-03-05 LAB — URINE CULTURE, OB REFLEX

## 2023-03-05 LAB — CULTURE, OB URINE

## 2023-03-07 ENCOUNTER — Telehealth: Payer: Self-pay

## 2023-03-07 NOTE — Telephone Encounter (Addendum)
-----   Message from Warden Fillers sent at 03/05/2023  9:13 AM EDT ----- Fasting blood sugar elevated, please refer to diabetic teaching, other third trimester labs normal  Attempted to contact pt with listed telephone number and received message that number is not in service.  Message sent to pt via MyChart.   Lorraine Rogers  03/07/23

## 2023-03-13 ENCOUNTER — Encounter: Payer: Self-pay | Admitting: *Deleted

## 2023-03-13 DIAGNOSIS — O24419 Gestational diabetes mellitus in pregnancy, unspecified control: Secondary | ICD-10-CM

## 2023-03-13 HISTORY — DX: Gestational diabetes mellitus in pregnancy, unspecified control: O24.419

## 2023-03-13 MED ORDER — NITROFURANTOIN MONOHYD MACRO 100 MG PO CAPS
100.0000 mg | ORAL_CAPSULE | Freq: Two times a day (BID) | ORAL | 0 refills | Status: DC
Start: 2023-03-13 — End: 2023-03-15

## 2023-03-13 NOTE — Addendum Note (Signed)
Addended by: Adam Phenix on: 03/13/2023 03:02 PM   Modules accepted: Orders

## 2023-03-15 ENCOUNTER — Ambulatory Visit: Payer: 59 | Attending: Maternal & Fetal Medicine

## 2023-03-15 ENCOUNTER — Ambulatory Visit: Payer: 59 | Admitting: *Deleted

## 2023-03-15 VITALS — BP 121/54 | HR 77

## 2023-03-15 DIAGNOSIS — F17210 Nicotine dependence, cigarettes, uncomplicated: Secondary | ICD-10-CM

## 2023-03-15 DIAGNOSIS — O99213 Obesity complicating pregnancy, third trimester: Secondary | ICD-10-CM | POA: Diagnosis not present

## 2023-03-15 DIAGNOSIS — A5901 Trichomonal vulvovaginitis: Secondary | ICD-10-CM

## 2023-03-15 DIAGNOSIS — E669 Obesity, unspecified: Secondary | ICD-10-CM | POA: Diagnosis not present

## 2023-03-15 DIAGNOSIS — Z3A29 29 weeks gestation of pregnancy: Secondary | ICD-10-CM | POA: Diagnosis not present

## 2023-03-15 DIAGNOSIS — O99333 Smoking (tobacco) complicating pregnancy, third trimester: Secondary | ICD-10-CM | POA: Insufficient documentation

## 2023-03-15 DIAGNOSIS — O99332 Smoking (tobacco) complicating pregnancy, second trimester: Secondary | ICD-10-CM

## 2023-03-15 DIAGNOSIS — O99212 Obesity complicating pregnancy, second trimester: Secondary | ICD-10-CM

## 2023-03-15 DIAGNOSIS — Z3480 Encounter for supervision of other normal pregnancy, unspecified trimester: Secondary | ICD-10-CM

## 2023-03-16 ENCOUNTER — Other Ambulatory Visit: Payer: Self-pay | Admitting: Maternal & Fetal Medicine

## 2023-03-16 DIAGNOSIS — O99213 Obesity complicating pregnancy, third trimester: Secondary | ICD-10-CM

## 2023-03-29 ENCOUNTER — Encounter: Payer: Self-pay | Admitting: Family Medicine

## 2023-03-29 ENCOUNTER — Encounter: Payer: MEDICAID | Admitting: Family Medicine

## 2023-03-29 NOTE — Progress Notes (Signed)
Patient did not keep appointment today. She will be called to reschedule.  

## 2023-04-03 ENCOUNTER — Inpatient Hospital Stay (HOSPITAL_COMMUNITY)
Admission: AD | Admit: 2023-04-03 | Discharge: 2023-04-03 | Disposition: A | Discharge status: Home / Self Care | Payer: 59 | Attending: Family Medicine | Admitting: Family Medicine

## 2023-04-03 ENCOUNTER — Encounter (HOSPITAL_COMMUNITY): Payer: Self-pay | Admitting: Obstetrics & Gynecology

## 2023-04-03 DIAGNOSIS — A5901 Trichomonal vulvovaginitis: Secondary | ICD-10-CM | POA: Diagnosis not present

## 2023-04-03 DIAGNOSIS — Z3A32 32 weeks gestation of pregnancy: Secondary | ICD-10-CM | POA: Insufficient documentation

## 2023-04-03 DIAGNOSIS — O4703 False labor before 37 completed weeks of gestation, third trimester: Secondary | ICD-10-CM | POA: Insufficient documentation

## 2023-04-03 DIAGNOSIS — A599 Trichomoniasis, unspecified: Secondary | ICD-10-CM

## 2023-04-03 DIAGNOSIS — Z3689 Encounter for other specified antenatal screening: Secondary | ICD-10-CM | POA: Diagnosis not present

## 2023-04-03 DIAGNOSIS — O99333 Smoking (tobacco) complicating pregnancy, third trimester: Secondary | ICD-10-CM | POA: Diagnosis not present

## 2023-04-03 DIAGNOSIS — O98313 Other infections with a predominantly sexual mode of transmission complicating pregnancy, third trimester: Secondary | ICD-10-CM | POA: Insufficient documentation

## 2023-04-03 DIAGNOSIS — Z3480 Encounter for supervision of other normal pregnancy, unspecified trimester: Secondary | ICD-10-CM

## 2023-04-03 DIAGNOSIS — F17210 Nicotine dependence, cigarettes, uncomplicated: Secondary | ICD-10-CM | POA: Insufficient documentation

## 2023-04-03 DIAGNOSIS — O479 False labor, unspecified: Secondary | ICD-10-CM

## 2023-04-03 LAB — URINALYSIS, MICROSCOPIC (REFLEX)

## 2023-04-03 LAB — URINALYSIS, ROUTINE W REFLEX MICROSCOPIC
Bilirubin Urine: NEGATIVE
Glucose, UA: NEGATIVE mg/dL
Hgb urine dipstick: NEGATIVE
Ketones, ur: 40 mg/dL — AB
Nitrite: POSITIVE — AB
Protein, ur: NEGATIVE mg/dL
Specific Gravity, Urine: 1.02 (ref 1.005–1.030)
pH: 6.5 (ref 5.0–8.0)

## 2023-04-03 LAB — WET PREP, GENITAL
Sperm: NONE SEEN
WBC, Wet Prep HPF POC: >=10 — AB (ref <10)
Yeast Wet Prep HPF POC: NONE SEEN

## 2023-04-03 MED ORDER — METRONIDAZOLE 500 MG PO TABS: 500.0000 mg | ORAL_TABLET | Freq: Two times a day (BID) | ORAL | 0 refills | Status: DC

## 2023-04-03 NOTE — MAU Note (Signed)
..  Lorraine Rogers is a 32 y.o. at [redacted]w[redacted]d here in MAU reporting: ctx that started this morning, also reporting shooting pelvic pain. States the ctx are every 3 minutes. Denies VB or LOF. Does endorse a thick white discharge for the last few weeks. +FM.   Pain score: 9 Vitals:   04/03/23 1750  BP: (!) 106/51  Pulse: 79  Resp: 15  Temp: 97.8 F (36.6 C)  SpO2: 99%     FHT:150 Lab orders placed from triage:   UA
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
## 2023-04-04 LAB — GC/CHLAMYDIA PROBE AMP (~~LOC~~) NOT AT ARMC
Chlamydia: NEGATIVE
Comment: NEGATIVE
Comment: NORMAL
Neisseria Gonorrhea: NEGATIVE

## 2023-04-06 LAB — CULTURE, OB URINE: Culture: >=100000 — AB

## 2023-04-10 ENCOUNTER — Telehealth: Payer: Self-pay

## 2023-04-10 NOTE — Telephone Encounter (Signed)
Called patient regarding her urine culture results. Patient denies any urinary symptoms such as burning sensation when voiding. Notified patient of positive trichomonas and to start treatment. Verified patient pharmacy and informed patient's partner to get tested as well.  Patient also mentioned that she had missed her OB appointment on 9/26 and would like to be rescheduled. Notified that we will have the front office reschedule her appointment. Patient verbalized understanding and denies any further questions.  Marcelino Duster, RN

## 2023-04-10 NOTE — Telephone Encounter (Signed)
-----   Message from Federico Flake sent at 04/10/2023 10:25 AM EDT ----- Lorraine Rogers present but contaminated sample, likely not a clean catch. Was positive for trich at the same visit. Please call to check in about symptoms. If having dysuria, please treat per standing order.
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
## 2023-04-18 ENCOUNTER — Other Ambulatory Visit: Payer: Self-pay

## 2023-04-18 ENCOUNTER — Ambulatory Visit: Payer: MEDICAID | Admitting: Certified Nurse Midwife

## 2023-04-18 VITALS — BP 100/67 | HR 86 | Wt 258.0 lb

## 2023-04-18 DIAGNOSIS — Z3483 Encounter for supervision of other normal pregnancy, third trimester: Secondary | ICD-10-CM

## 2023-04-18 DIAGNOSIS — Z3403 Encounter for supervision of normal first pregnancy, third trimester: Secondary | ICD-10-CM

## 2023-04-18 DIAGNOSIS — Z3A34 34 weeks gestation of pregnancy: Secondary | ICD-10-CM

## 2023-04-18 DIAGNOSIS — B3731 Acute candidiasis of vulva and vagina: Secondary | ICD-10-CM

## 2023-04-18 DIAGNOSIS — Z5982 Transportation insecurity: Secondary | ICD-10-CM

## 2023-04-18 DIAGNOSIS — Z3490 Encounter for supervision of normal pregnancy, unspecified, unspecified trimester: Secondary | ICD-10-CM

## 2023-04-18 DIAGNOSIS — O24419 Gestational diabetes mellitus in pregnancy, unspecified control: Secondary | ICD-10-CM

## 2023-04-18 DIAGNOSIS — F419 Anxiety disorder, unspecified: Secondary | ICD-10-CM

## 2023-04-18 DIAGNOSIS — F32A Depression, unspecified: Secondary | ICD-10-CM

## 2023-04-18 DIAGNOSIS — A599 Trichomoniasis, unspecified: Secondary | ICD-10-CM

## 2023-04-18 MED ORDER — FLUOXETINE HCL 10 MG PO CAPS
10.0000 mg | ORAL_CAPSULE | Freq: Every day | ORAL | 3 refills | Status: DC
Start: 2023-04-18 — End: 2023-09-22

## 2023-04-18 MED ORDER — TERCONAZOLE 0.4 % VA CREA
1.0000 | TOPICAL_CREAM | Freq: Every day | VAGINAL | 1 refills | Status: AC
Start: 2023-04-18 — End: 2023-04-25

## 2023-04-18 MED ORDER — HYDROXYZINE HCL 10 MG PO TABS
10.0000 mg | ORAL_TABLET | Freq: Three times a day (TID) | ORAL | 1 refills | Status: DC | PRN
Start: 2023-04-18 — End: 2023-09-22

## 2023-04-18 MED ORDER — BLOOD GLUCOSE MONITOR KIT
PACK | 0 refills | Status: DC
Start: 2023-04-18 — End: 2023-05-13

## 2023-04-18 MED ORDER — ACCU-CHEK GUIDE W/DEVICE KIT
1.0000 | PACK | Freq: Once | 0 refills | Status: AC
Start: 2023-04-18 — End: 2023-04-18

## 2023-04-18 MED ORDER — METRONIDAZOLE 500 MG PO TABS
500.0000 mg | ORAL_TABLET | Freq: Two times a day (BID) | ORAL | 0 refills | Status: DC
Start: 2023-04-18 — End: 2023-05-13

## 2023-04-18 NOTE — Addendum Note (Signed)
Addended by: Marjo Bicker on: 04/18/2023 06:25 PM   Modules accepted: Orders

## 2023-04-19 ENCOUNTER — Other Ambulatory Visit: Payer: Self-pay | Admitting: *Deleted

## 2023-04-19 ENCOUNTER — Other Ambulatory Visit: Payer: Self-pay

## 2023-04-19 ENCOUNTER — Ambulatory Visit: Payer: 59 | Attending: Maternal & Fetal Medicine

## 2023-04-19 DIAGNOSIS — E669 Obesity, unspecified: Secondary | ICD-10-CM

## 2023-04-19 DIAGNOSIS — Z3A34 34 weeks gestation of pregnancy: Secondary | ICD-10-CM

## 2023-04-19 DIAGNOSIS — O24419 Gestational diabetes mellitus in pregnancy, unspecified control: Secondary | ICD-10-CM

## 2023-04-19 DIAGNOSIS — O24410 Gestational diabetes mellitus in pregnancy, diet controlled: Secondary | ICD-10-CM | POA: Diagnosis not present

## 2023-04-19 DIAGNOSIS — F17210 Nicotine dependence, cigarettes, uncomplicated: Secondary | ICD-10-CM

## 2023-04-19 DIAGNOSIS — O99213 Obesity complicating pregnancy, third trimester: Secondary | ICD-10-CM | POA: Insufficient documentation

## 2023-04-19 DIAGNOSIS — O99333 Smoking (tobacco) complicating pregnancy, third trimester: Secondary | ICD-10-CM

## 2023-04-30 ENCOUNTER — Encounter: Payer: 59 | Admitting: Obstetrics and Gynecology

## 2023-05-10 ENCOUNTER — Ambulatory Visit: Payer: 59

## 2023-05-10 ENCOUNTER — Other Ambulatory Visit: Payer: Self-pay | Admitting: Obstetrics and Gynecology

## 2023-05-10 ENCOUNTER — Other Ambulatory Visit: Payer: Self-pay | Admitting: *Deleted

## 2023-05-10 ENCOUNTER — Other Ambulatory Visit: Payer: Self-pay

## 2023-05-10 DIAGNOSIS — F17210 Nicotine dependence, cigarettes, uncomplicated: Secondary | ICD-10-CM

## 2023-05-10 DIAGNOSIS — Z3A37 37 weeks gestation of pregnancy: Secondary | ICD-10-CM

## 2023-05-10 DIAGNOSIS — O24410 Gestational diabetes mellitus in pregnancy, diet controlled: Secondary | ICD-10-CM | POA: Diagnosis not present

## 2023-05-10 DIAGNOSIS — O99213 Obesity complicating pregnancy, third trimester: Secondary | ICD-10-CM | POA: Insufficient documentation

## 2023-05-10 DIAGNOSIS — O409XX0 Polyhydramnios, unspecified trimester, not applicable or unspecified: Secondary | ICD-10-CM

## 2023-05-10 DIAGNOSIS — E669 Obesity, unspecified: Secondary | ICD-10-CM | POA: Diagnosis not present

## 2023-05-10 DIAGNOSIS — O24419 Gestational diabetes mellitus in pregnancy, unspecified control: Secondary | ICD-10-CM | POA: Insufficient documentation

## 2023-05-10 DIAGNOSIS — O99333 Smoking (tobacco) complicating pregnancy, third trimester: Secondary | ICD-10-CM | POA: Diagnosis not present

## 2023-05-11 ENCOUNTER — Inpatient Hospital Stay (HOSPITAL_COMMUNITY)
Admission: AD | Admit: 2023-05-11 | Discharge: 2023-05-13 | DRG: 807 | Disposition: A | Discharge status: Home / Self Care | Payer: 59 | Attending: Obstetrics and Gynecology | Admitting: Obstetrics and Gynecology

## 2023-05-11 ENCOUNTER — Encounter: Payer: Self-pay | Admitting: Family Medicine

## 2023-05-11 ENCOUNTER — Ambulatory Visit (INDEPENDENT_AMBULATORY_CARE_PROVIDER_SITE_OTHER): Payer: 59 | Admitting: Family Medicine

## 2023-05-11 VITALS — BP 125/75 | HR 75 | Wt 255.3 lb

## 2023-05-11 DIAGNOSIS — O99210 Obesity complicating pregnancy, unspecified trimester: Secondary | ICD-10-CM

## 2023-05-11 DIAGNOSIS — Z833 Family history of diabetes mellitus: Secondary | ICD-10-CM

## 2023-05-11 DIAGNOSIS — Z8249 Family history of ischemic heart disease and other diseases of the circulatory system: Secondary | ICD-10-CM

## 2023-05-11 DIAGNOSIS — Z3A38 38 weeks gestation of pregnancy: Secondary | ICD-10-CM

## 2023-05-11 DIAGNOSIS — Z1331 Encounter for screening for depression: Secondary | ICD-10-CM | POA: Clinically undetermined

## 2023-05-11 DIAGNOSIS — Z3480 Encounter for supervision of other normal pregnancy, unspecified trimester: Secondary | ICD-10-CM

## 2023-05-11 DIAGNOSIS — O24424 Gestational diabetes mellitus in childbirth, insulin controlled: Secondary | ICD-10-CM | POA: Diagnosis not present

## 2023-05-11 DIAGNOSIS — O24419 Gestational diabetes mellitus in pregnancy, unspecified control: Secondary | ICD-10-CM

## 2023-05-11 DIAGNOSIS — O99213 Obesity complicating pregnancy, third trimester: Secondary | ICD-10-CM

## 2023-05-11 DIAGNOSIS — O99824 Streptococcus B carrier state complicating childbirth: Secondary | ICD-10-CM | POA: Diagnosis present

## 2023-05-11 DIAGNOSIS — O24420 Gestational diabetes mellitus in childbirth, diet controlled: Principal | ICD-10-CM | POA: Diagnosis present

## 2023-05-11 DIAGNOSIS — A5901 Trichomonal vulvovaginitis: Secondary | ICD-10-CM

## 2023-05-11 DIAGNOSIS — F17200 Nicotine dependence, unspecified, uncomplicated: Secondary | ICD-10-CM

## 2023-05-11 DIAGNOSIS — F322 Major depressive disorder, single episode, severe without psychotic features: Secondary | ICD-10-CM

## 2023-05-11 DIAGNOSIS — F17210 Nicotine dependence, cigarettes, uncomplicated: Secondary | ICD-10-CM | POA: Diagnosis present

## 2023-05-11 DIAGNOSIS — Z880 Allergy status to penicillin: Secondary | ICD-10-CM

## 2023-05-11 DIAGNOSIS — O24429 Gestational diabetes mellitus in childbirth, unspecified control: Secondary | ICD-10-CM | POA: Diagnosis present

## 2023-05-11 DIAGNOSIS — O23593 Infection of other part of genital tract in pregnancy, third trimester: Secondary | ICD-10-CM

## 2023-05-11 DIAGNOSIS — O24410 Gestational diabetes mellitus in pregnancy, diet controlled: Secondary | ICD-10-CM

## 2023-05-11 DIAGNOSIS — O99334 Smoking (tobacco) complicating childbirth: Secondary | ICD-10-CM | POA: Diagnosis present

## 2023-05-11 LAB — CBC
HCT: 32.4 % — ABNORMAL LOW (ref 36.0–46.0)
Hemoglobin: 11.1 g/dL — ABNORMAL LOW (ref 12.0–15.0)
MCH: 31.2 pg (ref 26.0–34.0)
MCHC: 34.3 g/dL (ref 30.0–36.0)
MCV: 91 fL (ref 80.0–100.0)
Platelets: 270 10*3/uL (ref 150–400)
RBC: 3.56 MIL/uL — ABNORMAL LOW (ref 3.87–5.11)
RDW: 14.1 % (ref 11.5–15.5)
WBC: 10.9 10*3/uL — ABNORMAL HIGH (ref 4.0–10.5)
nRBC: 0 % (ref 0.0–0.2)

## 2023-05-11 LAB — COMPREHENSIVE METABOLIC PANEL
ALT: 14 U/L (ref 0–44)
AST: 18 U/L (ref 15–41)
Albumin: 2.8 g/dL — ABNORMAL LOW (ref 3.5–5.0)
Alkaline Phosphatase: 156 U/L — ABNORMAL HIGH (ref 38–126)
Anion gap: 6 (ref 5–15)
BUN: <5 mg/dL — ABNORMAL LOW (ref 6–20)
CO2: 22 mmol/L (ref 22–32)
Calcium: 8.7 mg/dL — ABNORMAL LOW (ref 8.9–10.3)
Chloride: 108 mmol/L (ref 98–111)
Creatinine, Ser: 0.6 mg/dL (ref 0.44–1.00)
GFR, Estimated: >60 mL/min (ref >60)
Glucose, Bld: 78 mg/dL (ref 70–99)
Potassium: 3.5 mmol/L (ref 3.5–5.1)
Sodium: 136 mmol/L (ref 135–145)
Total Bilirubin: 0.9 mg/dL (ref <1.2)
Total Protein: 6.4 g/dL — ABNORMAL LOW (ref 6.5–8.1)

## 2023-05-11 LAB — HIV ANTIBODY (ROUTINE TESTING W REFLEX): HIV Screen 4th Generation wRfx: NONREACTIVE

## 2023-05-11 LAB — TYPE AND SCREEN
ABO/RH(D): A POS
Antibody Screen: NEGATIVE

## 2023-05-11 LAB — GLUCOSE, CAPILLARY
Glucose-Capillary: 111 mg/dL — ABNORMAL HIGH (ref 70–99)
Glucose-Capillary: 92 mg/dL (ref 70–99)

## 2023-05-11 MED ORDER — OXYTOCIN BOLUS FROM INFUSION
333.0000 mL | Freq: Once | INTRAVENOUS | Status: AC
  Administered 2023-05-12: 333 mL via INTRAVENOUS

## 2023-05-11 MED ORDER — CEFAZOLIN SODIUM-DEXTROSE 2-4 GM/100ML-% IV SOLN: 2.0000 g | Freq: Three times a day (TID) | INTRAVENOUS | Status: DC

## 2023-05-11 MED ORDER — MISOPROSTOL 50MCG HALF TABLET
50.0000 ug | ORAL_TABLET | Freq: Once | ORAL | Status: AC
Start: 2023-05-11 — End: 2023-05-11
  Administered 2023-05-11: 50 ug via ORAL
  Filled 2023-05-11: qty 1

## 2023-05-11 MED ORDER — OXYCODONE-ACETAMINOPHEN 5-325 MG PO TABS: 1.0000 | ORAL_TABLET | ORAL | Status: DC | PRN

## 2023-05-11 MED ORDER — OXYTOCIN-SODIUM CHLORIDE 30-0.9 UT/500ML-% IV SOLN
2.5000 [IU]/h | INTRAVENOUS | Status: DC
  Filled 2023-05-11: qty 500

## 2023-05-11 MED ORDER — CEFAZOLIN SODIUM-DEXTROSE 2-4 GM/100ML-% IV SOLN
2.0000 g | Freq: Once | INTRAVENOUS | Status: AC
  Administered 2023-05-11: 2 g via INTRAVENOUS
  Filled 2023-05-11: qty 100

## 2023-05-11 MED ORDER — CEFAZOLIN SODIUM-DEXTROSE 1-4 GM/50ML-% IV SOLN
1.0000 g | Freq: Three times a day (TID) | INTRAVENOUS | Status: DC
  Filled 2023-05-11: qty 50

## 2023-05-11 MED ORDER — MISOPROSTOL 25 MCG QUARTER TABLET
25.0000 ug | ORAL_TABLET | Freq: Once | ORAL | Status: AC
  Administered 2023-05-11: 25 ug via VAGINAL
  Filled 2023-05-11: qty 1

## 2023-05-11 MED ORDER — TERBUTALINE SULFATE 1 MG/ML IJ SOLN: 0.2500 mg | Freq: Once | INTRAMUSCULAR | Status: DC | PRN

## 2023-05-11 MED ORDER — OXYCODONE-ACETAMINOPHEN 5-325 MG PO TABS: 2.0000 | ORAL_TABLET | ORAL | Status: DC | PRN

## 2023-05-11 MED ORDER — LACTATED RINGERS IV SOLN: 500.0000 mL | INTRAVENOUS | Status: DC | PRN

## 2023-05-11 MED ORDER — ACETAMINOPHEN 325 MG PO TABS: 650.0000 mg | ORAL_TABLET | ORAL | Status: DC | PRN

## 2023-05-11 MED ORDER — FENTANYL CITRATE (PF) 100 MCG/2ML IJ SOLN
50.0000 ug | INTRAMUSCULAR | Status: DC | PRN
Start: 2023-05-11 — End: 2023-05-12
  Administered 2023-05-11: 100 ug via INTRAVENOUS
  Administered 2023-05-11: 50 ug via INTRAVENOUS
  Administered 2023-05-12: 100 ug via INTRAVENOUS
  Filled 2023-05-11 (×3): qty 2

## 2023-05-11 MED ORDER — SOD CITRATE-CITRIC ACID 500-334 MG/5ML PO SOLN: 30.0000 mL | ORAL | Status: DC | PRN

## 2023-05-11 MED ORDER — FLEET ENEMA RE ENEM: 1.0000 | ENEMA | RECTAL | Status: DC | PRN

## 2023-05-11 MED ORDER — ONDANSETRON HCL 4 MG/2ML IJ SOLN
4.0000 mg | Freq: Four times a day (QID) | INTRAMUSCULAR | Status: DC | PRN
Start: 2023-05-11 — End: 2023-05-12

## 2023-05-11 MED ORDER — LIDOCAINE HCL (PF) 1 % IJ SOLN: 30.0000 mL | INTRAMUSCULAR | Status: DC | PRN

## 2023-05-11 MED ORDER — LACTATED RINGERS IV SOLN: INTRAVENOUS | Status: DC

## 2023-05-11 MED ORDER — OXYTOCIN-SODIUM CHLORIDE 30-0.9 UT/500ML-% IV SOLN: 1.0000 m[IU]/min | INTRAVENOUS | Status: DC

## 2023-05-11 NOTE — Progress Notes (Signed)
Labor Progress Note Katie Ruff is a 32 y.o. [id number] at [redacted]w[redacted]d presented for IOL for uncontrolled GDM S: Feeling some cramping. Discussed role, risks, benefits and indications for foley balloon; Pt agreeable. Desires efficient labor course.   O:  BP (!) 118/93   Pulse 69   Temp 98 F (36.7 C) (Oral)   Resp 16   Ht 5\' 7"  (1.702 m)   Wt 115.7 kg   LMP 08/18/2022 (Within Days)   BMI 39.94 kg/m  EFM: 140/moderate variability/accels present/no decels  CVE: Dilation: 1.5 Effacement (%): Thick Station: Ballotable Presentation: Vertex Exam by:: Dr. Roque Lias (resident)   A&P: 32 y.o. [id number] [redacted]w[redacted]d admitted for IOL #Labor: Progressing well. FB placed with 52ml sterile fluid.  #Pain: IV fentanyl for FB placement #FWB: Cat I #GBS negative  #GDM: q4h CBG  Wyn Forster, MD 11:54 PM

## 2023-05-11 NOTE — Patient Instructions (Signed)
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
## 2023-05-11 NOTE — Progress Notes (Signed)
   Subjective:  Lorraine Rogers is a 32 y.o. [id number] at [redacted]w[redacted]d being seen today for ongoing prenatal care.  She is currently monitored for the following issues for this high-risk pregnancy and has History of suicide attempt; Obesity in pregnancy; MDD (major depressive disorder), severe (HCC); Borderline personality disorder in adolescent [facility]); Tobacco use disorder; Trichomonal vaginitis during pregnancy in first trimester; Supervision of other normal pregnancy, antepartum; BMI 40.0-44.9, adult (HCC); and Gestational diabetes on their problem list.  Patient reports no complaints.  Contractions: Irritability. Vag. Bleeding: None.  Movement: Present. Denies leaking of fluid.   The following portions of the patient's history were reviewed and updated as appropriate: allergies, current medications, past family history, past medical history, past social history, past surgical history and problem list. Problem list updated.  Objective:   Vitals:   05/11/23 1105  BP: 125/75  Pulse: 75  Weight: 255 lb 4.8 oz (115.8 kg)    Fetal Status: Fetal Heart Rate (bpm): 161   Movement: Present     General:  Alert, oriented and cooperative. Patient is in no acute distress.  Skin: Skin is warm and dry. No rash noted.   Cardiovascular: Normal heart rate noted  Respiratory: Normal respiratory effort, no problems with respiration noted  Abdomen: Soft, gravid, appropriate for gestational age. Pain/Pressure: Present     Pelvic: Vag. Bleeding: None     Cervical exam deferred        Extremities: Normal range of motion.  Edema: Trace  Mental Status: Normal mood and affect. Normal behavior. Normal judgment and thought content.   Urinalysis:      Assessment and Plan:  Pregnancy: [id number] at [redacted]w[redacted]d  1. Gestational diabetes mellitus (GDM) in third trimester, gestational diabetes method of control unspecified Multiple markers of poor control [place] yesterday, EFW 98%, AC>99%, AFI 24 Patient also admits to drinking lots of  soda and juice, "especially orange juice" Discussed direct admit to L&D, patient is in agreement L&D providers notified by secure chat - Ambulatory referral to Nutrition and Diabetic Education  2. Supervision of other normal pregnancy, antepartum BP and FHR normal  3. Trichomonal vaginitis during pregnancy in first trimester Completed treatment, TOC when she arrives at L&D  4. Tobacco use disorder Not addressed  5. Obesity in pregnancy   6. MDD (major depressive disorder), severe (HCC) Not addressed  Preterm labor symptoms and general obstetric precautions including but not limited to vaginal bleeding, contractions, leaking of fluid and fetal movement were reviewed in detail with the patient. Please refer to After Visit Summary for other counseling recommendations.  Return in 1 week (on 05/18/2023) for [facility], ob visit.   [name], MD

## 2023-05-12 ENCOUNTER — Encounter (HOSPITAL_COMMUNITY): Payer: Self-pay | Admitting: Obstetrics and Gynecology

## 2023-05-12 DIAGNOSIS — O24424 Gestational diabetes mellitus in childbirth, insulin controlled: Secondary | ICD-10-CM

## 2023-05-12 DIAGNOSIS — Z3A38 38 weeks gestation of pregnancy: Secondary | ICD-10-CM

## 2023-05-12 LAB — CBC
HCT: 30 % — ABNORMAL LOW (ref 36.0–46.0)
Hemoglobin: 10.2 g/dL — ABNORMAL LOW (ref 12.0–15.0)
MCH: 30.8 pg (ref 26.0–34.0)
MCHC: 34 g/dL (ref 30.0–36.0)
MCV: 90.6 fL (ref 80.0–100.0)
Platelets: 221 10*3/uL (ref 150–400)
RBC: 3.31 MIL/uL — ABNORMAL LOW (ref 3.87–5.11)
RDW: 14.1 % (ref 11.5–15.5)
WBC: 17.7 10*3/uL — ABNORMAL HIGH (ref 4.0–10.5)
nRBC: 0 % (ref 0.0–0.2)

## 2023-05-12 LAB — RPR: RPR Ser Ql: NONREACTIVE

## 2023-05-12 MED ORDER — SENNOSIDES-DOCUSATE SODIUM 8.6-50 MG PO TABS
2.0000 | ORAL_TABLET | ORAL | Status: DC
  Administered 2023-05-12 – 2023-05-13 (×2): 2 via ORAL
  Filled 2023-05-12 (×2): qty 2

## 2023-05-12 MED ORDER — SIMETHICONE 80 MG PO CHEW: 80.0000 mg | CHEWABLE_TABLET | ORAL | Status: DC | PRN

## 2023-05-12 MED ORDER — ACETAMINOPHEN 325 MG PO TABS: 650.0000 mg | ORAL_TABLET | ORAL | Status: DC | PRN

## 2023-05-12 MED ORDER — MEASLES, MUMPS & RUBELLA VAC IJ SOLR: 0.5000 mL | Freq: Once | INTRAMUSCULAR | Status: DC

## 2023-05-12 MED ORDER — WITCH HAZEL-GLYCERIN EX PADS: 1.0000 | MEDICATED_PAD | CUTANEOUS | Status: DC | PRN

## 2023-05-12 MED ORDER — TETANUS-DIPHTH-ACELL PERTUSSIS 5-2.5-18.5 LF-MCG/0.5 IM SUSY
0.5000 mL | PREFILLED_SYRINGE | Freq: Once | INTRAMUSCULAR | Status: DC
Start: 2023-05-13 — End: 2023-05-13

## 2023-05-12 MED ORDER — SODIUM CHLORIDE 0.9% FLUSH: 10.0000 mL | Freq: Two times a day (BID) | INTRAVENOUS | Status: DC

## 2023-05-12 MED ORDER — DIBUCAINE (PERIANAL) 1 % EX OINT: 1.0000 | TOPICAL_OINTMENT | CUTANEOUS | Status: DC | PRN

## 2023-05-12 MED ORDER — SODIUM CHLORIDE 0.9% FLUSH: 3.0000 mL | Freq: Two times a day (BID) | INTRAVENOUS | Status: DC

## 2023-05-12 MED ORDER — SODIUM CHLORIDE 0.9% FLUSH
3.0000 mL | INTRAVENOUS | Status: DC | PRN
Start: 2023-05-12 — End: 2023-05-13

## 2023-05-12 MED ORDER — BENZOCAINE-MENTHOL 20-0.5 % EX AERO: 1.0000 | INHALATION_SPRAY | CUTANEOUS | Status: DC | PRN

## 2023-05-12 MED ORDER — IBUPROFEN 600 MG PO TABS
600.0000 mg | ORAL_TABLET | Freq: Four times a day (QID) | ORAL | Status: DC
  Administered 2023-05-12 – 2023-05-13 (×5): 600 mg via ORAL
  Filled 2023-05-12 (×6): qty 1

## 2023-05-12 MED ORDER — ONDANSETRON HCL 4 MG PO TABS: 4.0000 mg | ORAL_TABLET | ORAL | Status: DC | PRN

## 2023-05-12 MED ORDER — PRENATAL MULTIVITAMIN CH
1.0000 | ORAL_TABLET | Freq: Every day | ORAL | Status: DC
  Administered 2023-05-12 – 2023-05-13 (×2): 1 via ORAL
  Filled 2023-05-12 (×2): qty 1

## 2023-05-12 MED ORDER — ZOLPIDEM TARTRATE 5 MG PO TABS: 5.0000 mg | ORAL_TABLET | Freq: Every evening | ORAL | Status: DC | PRN

## 2023-05-12 MED ORDER — ONDANSETRON HCL 4 MG/2ML IJ SOLN: 4.0000 mg | INTRAMUSCULAR | Status: DC | PRN

## 2023-05-12 MED ORDER — DIPHENHYDRAMINE HCL 25 MG PO CAPS: 25.0000 mg | ORAL_CAPSULE | Freq: Four times a day (QID) | ORAL | Status: DC | PRN

## 2023-05-12 MED ORDER — COCONUT OIL OIL: 1.0000 | TOPICAL_OIL | Status: DC | PRN

## 2023-05-12 NOTE — Discharge Summary (Signed)
Postpartum Discharge Summary  Date of Service updated***     Patient Name: Lorraine Rogers DOB: [birth year] MRN: [id number]  Date of admission: 05/11/2023 Delivery date:05/12/2023 Delivering provider: Wyn Forster Date of discharge: 05/12/2023  Admitting diagnosis: Labor and delivery, indication for care [O75.9] Intrauterine pregnancy: [redacted]w[redacted]d     Secondary diagnosis:  Principal Problem:   NSVD (normal spontaneous vaginal delivery)  Additional problems: ***    Discharge diagnosis: Term Pregnancy Delivered and GDM A1                                              Post partum procedures:{Postpartum procedures:23558} Augmentation: Cytotec and OP Foley Complications: {OB Labor/Delivery Complications:20784}  Hospital course: Induction of Labor With Vaginal Delivery   32 y.o. yo [id number] at [redacted]w[redacted]d was admitted to the hospital 05/11/2023 for induction of labor.  Indication for induction: A1 DM.  Patient had an labor course uncomplicated. Membrane Rupture Time/Date: 12:19 AM,05/12/2023  Delivery Method:Vaginal, Spontaneous Operative Delivery:N/A Episiotomy: None Lacerations:  None Details of delivery can be found in separate delivery note.  Patient had a postpartum course complicated by***. Patient is discharged home 05/12/23.  Newborn Data: Birth date:05/12/2023 Birth time:12:53 AM Gender:Female Living status:Living Apgars:9 ,9  Weight:3400 g  Magnesium Sulfate received: {Mag received:30440022} BMZ received: No Rhophylac:N/A MMR:Yes T-DaP:Given prenatally Flu: declined RSV Vaccine received: {RSV:31013} Transfusion:{Transfusion received:30440034}  Immunizations received: Immunization History  Administered Date(s) Administered   Influenza,inj,Quad PF,6+ Mos 03/20/2016   Tdap 03/20/2016, 04/18/2018, 02/28/2023    Physical exam  Vitals:   05/12/23 0111 05/12/23 0116 05/12/23 0317 05/12/23 0406  BP: 107/86 117/69 (!) 121/58 120/71  Pulse: (!) 117 (!) 173 (!) 53 73  Resp:   18  18  Temp:   98 F (36.7 C) 98 F (36.7 C)  TempSrc:   Oral Oral  SpO2:   100% 98%  Weight:      Height:       General: {Exam; general:21111117} Lochia: {Desc; appropriate/inappropriate:30686::"appropriate"} Uterine Fundus: {Desc; firm/soft:30687} Incision: {Exam; incision:21111123} DVT Evaluation: {Exam; dvt:2111122} Labs: Lab Results  Component Value Date   WBC 10.9 (H) 05/11/2023   HGB 11.1 (L) 05/11/2023   HCT 32.4 (L) 05/11/2023   MCV 91.0 05/11/2023   PLT 270 05/11/2023      Latest Ref Rng & Units 05/11/2023    1:10 PM  CMP  Glucose 70 - 99 mg/dL 78   BUN 6 - 20 mg/dL <5   Creatinine 4.40 - 1.00 mg/dL 1.02   Sodium 725 - 366 mmol/L 136   Potassium 3.5 - 5.1 mmol/L 3.5   Chloride 98 - 111 mmol/L 108   CO2 22 - 32 mmol/L 22   Calcium 8.9 - 10.3 mg/dL 8.7   Total Protein 6.5 - 8.1 g/dL 6.4   Total Bilirubin <4.4 mg/dL 0.9   Alkaline Phos 38 - 126 U/L 156   AST 15 - 41 U/L 18   ALT 0 - 44 U/L 14    Edinburgh Score:    06/27/2018    3:15 PM  Edinburgh Postnatal Depression Scale Screening Tool  I have been able to laugh and see the funny side of things. 0  I have looked forward with enjoyment to things. 0  I have blamed myself unnecessarily when things went wrong. 0  I have been anxious or worried for no good reason. 0  I have  felt scared or panicky for no good reason. 0  Things have been getting on top of me. 0  I have been so unhappy that I have had difficulty sleeping. 0  I have felt sad or miserable. 0  I have been so unhappy that I have been crying. 0  The thought of harming myself has occurred to me. 0  Edinburgh Postnatal Depression Scale Total 0   No data recorded  After visit meds:  Allergies as of 05/12/2023       Reactions   Penicillins Hives, Other (See Comments)   Has patient had a PCN reaction causing immediate rash, facial/tongue/throat swelling, SOB or lightheadedness with hypotension: Yes Has patient had a PCN reaction causing severe  rash involving mucus membranes or skin necrosis: No Has patient had a PCN reaction that required hospitalization No Has patient had a PCN reaction occurring within the last 10 years: Yes If all of the above answers are "NO", then may proceed with Cephalosporin use.   Sulfa Antibiotics Hives     Med Rec must be completed prior to using this [facility]***        Discharge home in stable condition Infant Feeding: Bottle and Breast Infant Disposition:home with mother Discharge instruction: per After Visit Summary and Postpartum booklet. Activity: Advance as tolerated. Pelvic rest for 6 weeks.  Diet: routine diet Future Appointments: Future Appointments  Date Time Provider Department Center  05/17/2023  1:15 PM [facility]  05/21/2023  1:55 PM Federico Flake, MD [facility]   Follow up Visit:  Follow-up Information     [facility] Follow up in 6 week(s).   Specialty: Obstetrics and Gynecology Contact information: [address] [place] [address] [phone]               Message sent to [facility] 11/9  Please schedule this patient for a In person postpartum visit in 6 weeks with the following provider: Any provider. Additional Postpartum F/U:Postpartum Depression checkup and 2 hour GTT  High risk pregnancy complicated by: GDM Delivery mode:  Vaginal, SpontaneousNSVD Anticipated Birth Control:  Unsure   05/12/2023 Wyn Forster, MD

## 2023-05-12 NOTE — Lactation Note (Signed)
This note was copied from a baby's chart. Lactation Consultation Note  Patient Name: Lorraine Rogers [id number] Date: 05/12/2023 Age:30 hours Reason for consult: Initial assessment;Exclusive pumping and bottle feeding;Early term 37-38.6wks  P4- MOB plans to exclusively pump and formula feed only. MOB breast fed with her other children, but stated that it caused attachment issues, so she does not want to latch infant to the breast. MOB has already been set up with a DEBP by the RN. LC reviewed how to use the pump. MOB is tired right now, so she did not want to pump. MOB denies having any questions or concerns at this moment.  Infant had not fed in 4 hrs, so LC offered to formula feed infant for MOB and she agreed. Infant drank 30 mL and tolerated the pace feeding well. Infant had a strong rhythmic suck.  LC reviewed feeding infant on cue 8-12x in 24 hrs, not allowing infant to go over 3 hrs without a feeding, CDC milk storage guidelines and LC services handout. LC encouraged MOB to call for further assistance as needed.  Maternal Data Does the patient have breastfeeding experience prior to this delivery?: Yes  Feeding Mother's Current Feeding Choice: Breast Milk and Formula Nipple Type: Slow - flow  Lactation Tools Discussed/Used Tools: Pump;Flanges Breast pump type: Double-Electric Breast Pump;Manual Pump Education: Setup, frequency, and cleaning;Milk Storage Reason for Pumping: exclusive pumping Pumping frequency: 15-20 min every 2-3 hrs  Interventions Interventions: DEBP;Hand pump;Education;Pace feeding;LC Services brochure  Discharge Discharge Education: Warning signs for feeding baby Pump: DEBP;Personal  Consult Status Consult Status: Follow-up Date: 05/13/23 Follow-up type: In-patient    Dema Severin BS, IBCLC 05/12/2023, 4:18 PM

## 2023-05-12 NOTE — Lactation Note (Signed)
This note was copied from a baby's chart. Lactation Consultation Note  Patient Name: Lorraine Rogers [id number] Date: 05/12/2023 Age:30 hours   P3- LC to room for initial consult. MOB was sleeping at this time. LC will try to attempt to see MOB again later today.   Dema Severin BS, IBCLC 05/12/2023, 2:14 PM

## 2023-05-13 DIAGNOSIS — Z1331 Encounter for screening for depression: Secondary | ICD-10-CM | POA: Clinically undetermined

## 2023-05-13 MED ORDER — OXYCODONE HCL 5 MG PO TABS: 5.0000 mg | ORAL_TABLET | ORAL | Status: DC | PRN

## 2023-05-13 MED ORDER — ACETAMINOPHEN 500 MG PO TABS: 1000.0000 mg | ORAL_TABLET | Freq: Four times a day (QID) | ORAL | Status: DC | PRN

## 2023-05-13 MED ORDER — IBUPROFEN 600 MG PO TABS: 600.0000 mg | ORAL_TABLET | Freq: Four times a day (QID) | ORAL | 0 refills | Status: DC

## 2023-05-13 NOTE — Progress Notes (Signed)
Post Partum Day 1 Subjective: up ad lib, voiding, tolerating PO, and moderate cramping 5/10. Not quite due for IBU. Scant bleeding.  Objective: Blood pressure (!) 100/54, pulse (!) 53, temperature 98.4 F (36.9 C), temperature source Oral, resp. rate 16, height 5\' 7"  (1.702 m), weight 115.7 kg, last menstrual period 08/18/2022, SpO2 100%.  Physical Exam:  General: alert, cooperative, appears stated age, and no distress Lochia: appropriate Uterine Fundus: firm Incision: NA DVT Evaluation: No evidence of DVT seen on physical exam.  Recent Labs    05/11/23 1310 05/12/23 0500  HGB 11.1* 10.2*  HCT 32.4* 30.0*    Assessment/Plan: Plan for discharge tomorrow, Lactation consult, Social Work consult, Circumcision prior to discharge, and Contraception OP Nexplanon    LOS: 2 days   [place], CNM 05/13/2023, 10:10 AM

## 2023-05-13 NOTE — Progress Notes (Signed)
CSW returned to room and provided car seat, pack n play, and baby bundles. MOB thanked CSW and signed forms. MOB reported that she will have car seat money prior to 4pm today.   Celso Sickle, LCSW Clinical Social Worker [facility] Cell#: [phone]

## 2023-05-13 NOTE — Clinical Social Work Maternal (Signed)
CLINICAL SOCIAL WORK MATERNAL/CHILD NOTE  Patient Details  Name: Lorraine Rogers MRN: [id number] Date of Birth: 1990/07/21  Date:  05/13/2023  Clinical Social Worker Initiating Note:  Celso Sickle, [place] Date/Time: Initiated:  05/13/23/1056     Child's Name:  Lorraine Rogers   Biological Parents:  Mother, Father (Father: Bennie Hind.)   Need for Interpreter:  None   Reason for Referral:  Behavioral Health Concerns, Other (Comment) (Resources)   Address:  [address] [place] [address]    Phone number:  [phone] (home)     Additional phone number:   Household Members/Support Persons (HM/SP):   Household Member/Support Person 1, Household Member/Support Person 2, Household Member/Support Person 3, Household Member/Support Person 4, Household Member/Support Person 5   HM/SP Name Relationship DOB or Age  HM/SP -1 Lacretia Leigh Grandmother    HM/SP -2 Bennie Hind. FOB/Husband    HM/SP -3 Margy Willitts daughter 05/10/18  HM/SP -4 Harjot Govea daughter 05/26/16  HM/SP -5 Cayden Mareno son 03/24/11  HM/SP -6        HM/SP -7        HM/SP -8          Natural Supports (not living in the home):      Professional Supports: None   Employment: Unemployed   Type of Work:     Education:  Engineer, agricultural   Homebound arranged:    Surveyor, quantity Resources:  [employer]   Other Resources:  Sales executive     Cultural/Religious Considerations Which May Impact Care:    Strengths:  Ability to meet basic needs  , Psychotropic Medications, Pediatrician chosen   Psychotropic Medications:  Prozac      Pediatrician:    Armed forces operational officer area  Pediatrician List:   [facility]  [place]    [place]    [place] [facility]      Pediatrician Fax Number:    Risk Factors/Current Problems:  Basic Needs  , Transportation  , Mental Health Concerns     Cognitive State:  Able to Concentrate  , Alert  , Linear  Thinking  , Goal Oriented  , Insightful     Mood/Affect:  Euthymic  , Comfortable  , Calm  , Interested  , Relaxed     CSW Assessment: CSW met with MOB at bedside to complete psychosocial assessment, FOB present. CSW introduced self and asked to speak with MOB privately, FOB left the room. CSW explained reason for consult. MOB was welcoming, pleasant, open, and remained engaged during assessment. MOB reported that she is temporarily staying at her grandmother's home due to recently losing her home. MOB reported that they did not anticipate infant coming early and planned to purchase needed baby items on Friday. CSW informed MOB about the available resources at the hospital (car seat, pack n play, and baby bundle). MOB reported that all resources would be helpful. CSW agreed to provide if available. MOB verbalized understanding. MOB reported that she has a cousin that will assist with other needed baby items. CSW inquired about MOB's support system, MOB reported that her grandmother and FOB/Husband are supports.   CSW inquired about MOB's mental health history. 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 Per chart review, MOB has a Borderline Personality Disorder diagnosis. MOB denied any current symptoms of her mental health diagnoses and shared that she plans to restart her medications  post delivery. Per chart review, MOB was previously taking Prozac and Vistaril. MOB reported that her only concerns that may trigger mental health symptoms are external stressors and her mom's recent death anniversary. CSW acknowledged MOB's insight and inquired about MOB's participation in therapy. MOB reported that her provider told her to start going to therapy again. CSW asked if MOB needed any therapy resources, MOB denied needing any therapy resources. CSW and MOB discussed elevated edinburgh score 10. MOB attributed elevated score to stressors with building  everything back up again. CSW acknowledged and validated how stressful that may be. CSW inquired about how MOB was feeling emotionally since giving birth, MOB reported that she was feeling okay. MOB presented calm and did not demonstrate any acute mental health signs/symptoms. CSW assessed for safety, MOB denied SI, HI, and domestic violence.   CSW provided education regarding the baby blues period vs. perinatal mood disorders, discussed treatment and gave resources for mental health follow up if concerns arise.  CSW recommends self-evaluation during the postpartum time period using the New Mom Checklist from Postpartum Progress and encouraged MOB to contact a medical professional if symptoms are noted at any time.    CSW provided review of Sudden Infant Death Syndrome (SIDS) precautions.    CSW asked if MOB was interested in any community referrals for resources/support, MOB declined. CSW provided MOB with new parents resource. CSW inquired about food insecurity, MOB denied any current concerns and reported that she has food stamps. CSW inquired about transportation needs, MOB reported that they utilize the bus system. CSW informed MOB about Medicaid transportation and provided information as an additional resource.   CSW will return and provide available resources.   CSW Plan/Description:  Sudden Infant Death Syndrome (SIDS) Education, Perinatal Mood and Anxiety Disorder (PMADs) Education, No Further Intervention Required/No Barriers to Discharge, Other    Antionette Poles, LCSW 05/13/2023, 11:00 AM

## 2023-05-14 ENCOUNTER — Telehealth (HOSPITAL_COMMUNITY): Payer: Self-pay | Admitting: *Deleted

## 2023-05-14 DIAGNOSIS — Z1331 Encounter for screening for depression: Secondary | ICD-10-CM

## 2023-05-14 NOTE — Telephone Encounter (Signed)
Patient scored 10 on EPDS in the hospital with answer to question ten being "0-Never".  Placed order for [facility] referral.  Dr. Adrian Blackwater notified via [facility] order.

## 2023-05-15 ENCOUNTER — Telehealth: Payer: Self-pay

## 2023-05-15 ENCOUNTER — Telehealth: Payer: Self-pay | Admitting: Clinical

## 2023-05-15 NOTE — Telephone Encounter (Signed)
Attempt call regarding referral; Unable to leave message as voicemail has not been set up.

## 2023-05-17 ENCOUNTER — Other Ambulatory Visit: Payer: 59

## 2023-05-21 ENCOUNTER — Encounter: Payer: 59 | Admitting: Family Medicine

## 2023-05-26 ENCOUNTER — Telehealth (HOSPITAL_COMMUNITY): Payer: Self-pay

## 2023-05-26 NOTE — Telephone Encounter (Signed)
05/26/2023 0935  Name: Srinithi Anzaldua MRN: [id number] DOB: Nov 07, 1990  Reason for Call:  Transition of Care Hospital Discharge Call  Contact Status: Patient Contact Status: Unable to contact No voicemail option.  Language assistant needed:          Follow-Up Questions:    Inocente Salles Postnatal Depression Scale:  In the Past 7 Days:    PHQ2-9 Depression Scale:     Discharge Follow-up:    Post-discharge interventions: NA  Signature  Signe Colt

## 2023-06-25 ENCOUNTER — Other Ambulatory Visit: Payer: 59

## 2023-06-25 ENCOUNTER — Other Ambulatory Visit: Payer: Self-pay

## 2023-06-25 ENCOUNTER — Ambulatory Visit: Payer: 59 | Admitting: Obstetrics and Gynecology

## 2023-06-25 DIAGNOSIS — O24419 Gestational diabetes mellitus in pregnancy, unspecified control: Secondary | ICD-10-CM

## 2023-07-10 ENCOUNTER — Encounter: Payer: Self-pay | Admitting: Obstetrics and Gynecology

## 2023-07-10 ENCOUNTER — Other Ambulatory Visit (HOSPITAL_COMMUNITY)
Admission: RE | Admit: 2023-07-10 | Discharge: 2023-07-10 | Disposition: A | Discharge status: Home / Self Care | Payer: 59 | Source: Ambulatory Visit | Attending: Obstetrics and Gynecology | Admitting: Obstetrics and Gynecology

## 2023-07-10 ENCOUNTER — Ambulatory Visit (INDEPENDENT_AMBULATORY_CARE_PROVIDER_SITE_OTHER): Payer: 59 | Admitting: Obstetrics and Gynecology

## 2023-07-10 ENCOUNTER — Other Ambulatory Visit: Payer: Self-pay

## 2023-07-10 VITALS — BP 125/75 | HR 73 | Wt 229.4 lb

## 2023-07-10 DIAGNOSIS — O24419 Gestational diabetes mellitus in pregnancy, unspecified control: Secondary | ICD-10-CM

## 2023-07-10 DIAGNOSIS — Z8619 Personal history of other infectious and parasitic diseases: Secondary | ICD-10-CM

## 2023-07-10 MED ORDER — LEVONORGESTREL 1.5 MG PO TABS: 1.5000 mg | ORAL_TABLET | Freq: Once | ORAL | 0 refills | Status: AC

## 2023-07-10 NOTE — Progress Notes (Signed)
    Post Partum Visit Note  Lorraine Rogers is a 33 y.o. [id number] s/p 11/9 SVD/intact perineum at 38/1 after IOL for uncontrolled GDM.    Anesthesia:  IV Fentanyl  . Postpartum course has been uncomplicated. Baby is doing well. Baby is feeding by bottle - Soy Simliac . Bleeding no bleeding. Bowel function is normal. Bladder function is normal. Patient is sexually active. Contraception method is none. Postpartum depression screening: negative.  LMP 2-3 weeks ago and unprotected intercourse this past weekend (no issues)   Review of Systems Pertinent items are noted in HPI.  Objective:  BP 125/75   Pulse 73   Wt 229 lb 6.4 oz (104.1 kg)   LMP 08/18/2022 (Within Days)   Breastfeeding No   BMI 35.93 kg/m    General: NAD  Assessment:   Patient stable   Plan:  *PP: routine care. Patient would like nexplanon  but given LMP and intercourse timing, I don't recommend. I d/w her re: using IUD as emergency contraception, but patient declines. Patient amenable to Plan B , abstinence and RTC in 3 weeks for Nexplanon  placement.  Diagnosis  Date Value Ref Range Status  12/11/2022   Final   - Negative for intraepithelial lesion or malignancy (NILM)  *GDM: patient declines PP GTT. Pt amenable to A1c, fasting  *+Trich: self swab test of cure today  Bebe Furry, MD Center for [facility], [facility]

## 2023-07-11 ENCOUNTER — Encounter: Payer: Self-pay | Admitting: Obstetrics and Gynecology

## 2023-07-11 LAB — GLUCOSE, RANDOM: Glucose: 77 mg/dL (ref 70–99)

## 2023-07-11 LAB — HEMOGLOBIN A1C
Est. average glucose Bld gHb Est-mCnc: 105 mg/dL
Hgb A1c MFr Bld: 5.3 % (ref 4.8–5.6)

## 2023-07-12 ENCOUNTER — Encounter: Payer: Self-pay | Admitting: Obstetrics and Gynecology

## 2023-07-12 LAB — CERVICOVAGINAL ANCILLARY ONLY
Bacterial Vaginitis (gardnerella): POSITIVE — AB
Candida Glabrata: NEGATIVE
Candida Vaginitis: NEGATIVE
Chlamydia: NEGATIVE
Comment: NEGATIVE
Comment: NEGATIVE
Comment: NEGATIVE
Comment: NEGATIVE
Comment: NEGATIVE
Comment: NORMAL
Neisseria Gonorrhea: NEGATIVE
Trichomonas: POSITIVE — AB

## 2023-07-12 MED ORDER — METRONIDAZOLE 500 MG PO TABS: 500.0000 mg | ORAL_TABLET | Freq: Two times a day (BID) | ORAL | 0 refills | Status: AC

## 2023-07-12 NOTE — Addendum Note (Signed)
 Addended by: Energy Bing on: 07/12/2023 09:40 PM   Modules accepted: Orders

## 2023-08-02 ENCOUNTER — Other Ambulatory Visit: Payer: Self-pay

## 2023-08-02 ENCOUNTER — Encounter: Payer: Self-pay | Admitting: Family Medicine

## 2023-08-02 ENCOUNTER — Ambulatory Visit: Payer: 59 | Admitting: Family Medicine

## 2023-08-02 VITALS — BP 124/79 | HR 60 | Wt 232.0 lb

## 2023-08-02 DIAGNOSIS — Z30017 Encounter for initial prescription of implantable subdermal contraceptive: Secondary | ICD-10-CM | POA: Diagnosis not present

## 2023-08-02 DIAGNOSIS — Z975 Presence of (intrauterine) contraceptive device: Secondary | ICD-10-CM | POA: Insufficient documentation

## 2023-08-02 MED ORDER — FLUCONAZOLE 150 MG PO TABS: 150.0000 mg | ORAL_TABLET | Freq: Once | ORAL | 0 refills | Status: AC

## 2023-08-02 MED ORDER — ETONOGESTREL 68 MG ~~LOC~~ IMPL
68.0000 mg | DRUG_IMPLANT | Freq: Once | SUBCUTANEOUS | Status: AC
  Administered 2023-08-02: 68 mg via SUBCUTANEOUS

## 2023-08-02 NOTE — Progress Notes (Signed)
     GYNECOLOGY OFFICE PROCEDURE NOTE  Lorraine Rogers is a 33 y.o. [id number] here for Nexplanon insertion.    Last pap smear:    Component Value Date/Time   DIAGPAP  12/11/2022 1130    - Negative for intraepithelial lesion or malignancy (NILM)   ADEQPAP  12/11/2022 1130    Satisfactory for evaluation; transformation zone component PRESENT.     Nexplanon insertion Procedure Patient identified, informed consent performed, consent signed.   Patient does understand that irregular bleeding is a very common side effect of this medication. She was advised to have backup contraception for at least one week after placement. Pregnancy test in clinic today was negative .  Appropriate time out taken.  Patient's left arm was prepped and draped in the usual sterile fashion. Patient was prepped with alcohol swab and then injected with 3 ml of 1% lidocaine.  She was prepped with betadine, Nexplanon removed from packaging,  Device confirmed in needle, then inserted full length of needle and withdrawn per handbook instructions. Nexplanon was able to palpated in the patient's arm; patient palpated the insert herself. There was minimal blood loss.  Patient insertion site covered with guaze and a pressure bandage to reduce any bruising.  The patient tolerated the procedure well and was given post procedure instructions.  Of note patient was recently diagnosed with trichomonas and BV but has not yet picked up treatment. She reports frequent yeast infections with taking flagyl, prophylactic dose of fluconazole sent to her pharmacy.    [name], MD, MPH, FAAFP Attending Family Medicine Physician, [facility] for [facility], [facility]

## 2023-08-23 ENCOUNTER — Encounter: Payer: Self-pay | Admitting: General Practice
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
## 2023-09-21 ENCOUNTER — Ambulatory Visit: Payer: Self-pay

## 2023-09-21 DIAGNOSIS — F419 Anxiety disorder, unspecified: Secondary | ICD-10-CM

## 2023-09-21 DIAGNOSIS — Z658 Other specified problems related to psychosocial circumstances: Secondary | ICD-10-CM

## 2023-09-21 DIAGNOSIS — F332 Major depressive disorder, recurrent severe without psychotic features: Secondary | ICD-10-CM

## 2023-09-21 NOTE — Patient Instructions (Addendum)
 Center for [facility] at [facility] for Women [address] [place], [place] [address] [phone] (main office) [phone] [facility] office)  New Parent Support Groups www.postpartum.net www.conehealthybaby.com  [place] Copy  (Childcare options, Early childcare development, etc.) www.guilfordchildren.org  [employer] Child Care Facility Search Engine  https://ncchildcare.http://cook.com/  [facility]  [address], [place], [place] [address] [phone] or [phone] [facility] 24/7 FOR ANYONE [address], [place], [place]  [phone] Fax: [phone] guilfordcareinmind.com *Interpreters available *Accepts all insurance and uninsured for Urgent Care needs *Accepts Medicaid and uninsured for outpatient treatment (below)    ONLY FOR [facility]  Below:   Outpatient New Patient Assessment/Therapy Walk-ins:        Monday -Thursday 8am until slots are full.        Every Friday 1pm-4pm  (first come, first served)                   New Patient Psychiatry/Medication Management        Monday-Friday 8am-11am (first come, first served)              For all walk-ins we ask that you arrive by 7:15am, because patients will be seen in the order of arrival.    [employer] and Websites Here are a few free apps meant to help you to help yourself.  To find, try searching on the internet to see if the app is offered on Apple/Android devices. If your first choice doesn't come up on your device, the good news is that there are many choices! Play around with different apps to see which ones are helpful to you.    Calm This is an app meant to help increase calm feelings. Includes info, strategies, and tools for tracking your feelings.      Calm Harm  This app is meant to help with self-harm. Provides many 5-minute or 15-min coping strategies for doing instead of hurting yourself.        Healthy Minds Health Minds is a problem-solving tool to help deal with emotions and cope with stress you encounter wherever you are.      MindShift This app can help people cope with anxiety. Rather than trying to avoid anxiety, you can make an important shift and face it.      MY3  MY3 features a support system, safety plan and resources with the goal of offering a tool to use in a time of need.       My Life My Voice  This mood journal offers a simple solution for tracking your thoughts, feelings and moods. Animated emoticons can help identify your mood.       Relax Melodies Designed to help with sleep, on this app you can mix sounds and meditations for relaxation.      Smiling Mind Smiling Mind is meditation made easy: it's a simple tool that helps put a smile on your mind.        Stop, Breathe & Think  A friendly, simple guide for people through meditations for mindfulness and compassion.  Stop, Breathe and Think Kids Enter your current feelings and choose a "mission" to help you cope. Offers videos for certain moods instead of just sound recordings.       Team Orange The goal of this tool is to help teens change how they think, act, and react. This app helps you focus on your own good feelings and experiences.  The United Stationers Box The United Stationers Box (VHB) contains simple tools to help patients with coping, relaxation, distraction, and positive thinking.    Housing Resources                    [employer] (serves [place], [place], [place], [place], [place], [place], [place], [place], [place], [place], [place], [place], and [place] counties) [address] [place], [place] [address] [phone] DeveloperU.ch  **Rental assistance, Home Rehabilitation,Weatherization Assistance Program, Chief Financial Officer, Housing Voucher Program  [employer] for Housing and [employer]  Studies: Proofreader Resources to residents of [place], [place], and [place] Make sure you have your documents ready, including:  (Household income verification: 2 months pay stubs, unemployment/social security award letter, statement of no income for all household members over 12) Photo ID for all household members over 18 Utility Bill/Rent Ledger/Lease: must show past due amount for utilities/rent, or the rental agreement if rent is current 2. Start your application online or by paper (in [place] or [place]) at:     https://www.castaneda.biz/  3. Once you have completed the online application, you will get an email confirmation message from the county. Expect to hear back by phone or email at least 6-10 weeks from submitting your application.  4. While you wait:  Call [phone] to check in on your application Let your landlord know that you've applied. Your landlord will be asked to submit documents (W-9) during this application process. Payments will be made directly to the landlord/property management company and utility company Rent or utility assistance for [employer], [employer], and [employer] Apply at https://rb.gy/[email or web address] Questions? Call or email Renee at [phone] or [email or web address]   Eviction Mediation Program: The HOPE Program Https://www.rebuild.BedroomRental.com.cy HOPE Progam serves low-income renters in [address] [place] counties, defined as less than or equal to 80% of the area median income for the county where the renter lives. In the following 12 counties, you should apply to your local rent and utility assistance program INSTEAD OF the HOPE Program: [place], [place], [place], [place], [place], [place], [place], [place], [place], [place], [place], [place]  If you live outside of [place], contact HOPE call center at [phone] to talk to a Program Representative  Monday-Friday, 8am-5pm Note that Native American tribes also received federal funding for rent and utility assistance programs. Recognized members of the following tribes will be served by programs managed by tribal governments, including: [place], [place], [place], [place] of [place] and [facility] Eviction Mediation Coordinator, [place], [phone] [email or web address] .[employer], [place], [phone] [email or web address]   Housing Resources [facility] [place] [address] [place], [place] [address] [phone] www.gha-[place].[facility] [address] [place], [place] [address] [phone] PhoneCaptions.ch **Programs include: Hospital doctor and Housing Counseling, Healthy Radiographer, therapeutic, Homeless Prevention and Housing Assistance  Government [facility] [address], [place], [place] [address] [phone] www.PaintingEmporium.co.za **housing applications/recertification; tax payment relief/exemption under specific qualifications  [facility] [address] [place], [place] [address] www.onlinegreensboro.com/~maryshouse **transitional housing for women in recovery who have minor children or are pregnant  [facility] [address] [place], [place], [place] [address] https://johnson-smith.net/  **emergency shelter and support services for families facing homelessness  [employer] [address] [place], [place] [address] [phone] www.youthfocus.org **transitional housing to pregnant women; emergency housing for youth who have run away, are experiencing  a family crisis, are victims of abuse or neglect, or are homeless  [facility] [address], [place], [place] [address] [phone] ircgso.org **Drop-in center for people experiencing homelessness;  overnight warming center when temperature is 25 degrees or below  Re[employer]Entry [employer] [address] [place], [place], [place] [address] [phone] https://reentrystaffingagency.org/ **help with affordable housing to people experiencing homelessness or unemployment due to incarceration  [facility] [address] [place], [place] [address] [phone] www.greensborourbanministry.org  **emergency and transitional housing, rent/mortgage assistance, utility assistance  [facility] [address] [place], [place] [address] [phone] www.salvationarmyofgreensboro.org **emergency and transitional housing  H[employer] for [employer] [address], [place], [place] [address] [phone] Www.habitatgreensboro.[facility]   [employer] [address], [place], [place] [address] [phone] https://chshousing.org **Home Ownership/Affordable Housing Program and [facility]  Housing Consultants Group [address], [place], [place] [address] [phone] PaidValue.com.cy **home buyer education courses, foreclosure prevention  [facility] [address], [place], [place] [address] [phone] DefMagazine.is **Environmental Exposure Assessment (investigation of homes where either children or pregnant women with a confirmed elevated blood lead level reside)  [facility] [address] [place], [place] [address] [phone] ShowReturn.ca **Home Expense Assistance/Repairs Program; offers home accessibility updates, such as ramps or bars in the bathroom  [facility] [address], [place], [place] [address] [phone] https://www.self-help.org/locations/-branch **Offers credit-building and banking  services to people unable to use traditional banking

## 2023-09-22 ENCOUNTER — Other Ambulatory Visit: Payer: Self-pay | Admitting: Certified Nurse Midwife

## 2023-09-22 DIAGNOSIS — F419 Anxiety disorder, unspecified: Secondary | ICD-10-CM

## 2023-09-22 MED ORDER — FLUOXETINE HCL 10 MG PO CAPS
10.0000 mg | ORAL_CAPSULE | Freq: Every day | ORAL | 3 refills | Status: AC
Start: 2023-09-22 — End: ?

## 2023-09-22 MED ORDER — HYDROXYZINE HCL 10 MG PO TABS: 10.0000 mg | ORAL_TABLET | Freq: Three times a day (TID) | ORAL | 1 refills | Status: AC | PRN

## 2023-09-22 NOTE — Progress Notes (Signed)
 Refill for medications previously prescribed as requested through [facility].   Lamont Snowball, MSN, CNM, RNC-OB Certified Nurse Midwife, [facility] 09/22/2023 8:45 PM
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
## 2023-10-09 ENCOUNTER — Ambulatory Visit (INDEPENDENT_AMBULATORY_CARE_PROVIDER_SITE_OTHER): Payer: MEDICAID | Admitting: Clinical

## 2023-10-09 DIAGNOSIS — F332 Major depressive disorder, recurrent severe without psychotic features: Secondary | ICD-10-CM | POA: Diagnosis not present

## 2023-10-09 DIAGNOSIS — Z658 Other specified problems related to psychosocial circumstances: Secondary | ICD-10-CM

## 2023-10-09 DIAGNOSIS — F419 Anxiety disorder, unspecified: Secondary | ICD-10-CM

## 2023-10-16 NOTE — BH Specialist Note (Signed)
 Pt did not arrive to video visit and did not answer the phone; I have been unable to reach this patient by phone.  Unable to leave voicemail; left MyChart message for patient.

## 2023-10-23 ENCOUNTER — Other Ambulatory Visit: Payer: Self-pay

## 2023-10-23 ENCOUNTER — Ambulatory Visit (HOSPITAL_COMMUNITY)
Admission: EM | Admit: 2023-10-23 | Discharge: 2023-10-23 | Disposition: A | Discharge status: Home / Self Care | Payer: MEDICAID | Attending: Emergency Medicine | Admitting: Emergency Medicine

## 2023-10-23 ENCOUNTER — Emergency Department (HOSPITAL_BASED_OUTPATIENT_CLINIC_OR_DEPARTMENT_OTHER): Payer: MEDICAID

## 2023-10-23 ENCOUNTER — Encounter (HOSPITAL_COMMUNITY)
Admission: EM | Disposition: A | Discharge status: Home / Self Care | Payer: Self-pay | Source: Home / Self Care | Attending: Emergency Medicine

## 2023-10-23 ENCOUNTER — Encounter (HOSPITAL_COMMUNITY): Payer: Self-pay

## 2023-10-23 ENCOUNTER — Emergency Department (HOSPITAL_COMMUNITY): Payer: MEDICAID

## 2023-10-23 ENCOUNTER — Other Ambulatory Visit (HOSPITAL_COMMUNITY): Payer: Self-pay

## 2023-10-23 DIAGNOSIS — K8010 Calculus of gallbladder with chronic cholecystitis without obstruction: Secondary | ICD-10-CM | POA: Insufficient documentation

## 2023-10-23 DIAGNOSIS — F32A Depression, unspecified: Secondary | ICD-10-CM | POA: Diagnosis not present

## 2023-10-23 DIAGNOSIS — Z87440 Personal history of urinary (tract) infections: Secondary | ICD-10-CM | POA: Diagnosis not present

## 2023-10-23 DIAGNOSIS — K8020 Calculus of gallbladder without cholecystitis without obstruction: Secondary | ICD-10-CM

## 2023-10-23 DIAGNOSIS — F17210 Nicotine dependence, cigarettes, uncomplicated: Secondary | ICD-10-CM | POA: Diagnosis not present

## 2023-10-23 DIAGNOSIS — K8018 Calculus of gallbladder with other cholecystitis without obstruction: Secondary | ICD-10-CM | POA: Diagnosis present

## 2023-10-23 LAB — CBC WITH DIFFERENTIAL/PLATELET
Abs Immature Granulocytes: 0.04 10*3/uL (ref 0.00–0.07)
Basophils Absolute: 0 10*3/uL (ref 0.0–0.1)
Basophils Relative: 0 %
Eosinophils Absolute: 0.5 10*3/uL (ref 0.0–0.5)
Eosinophils Relative: 4 %
HCT: 39.1 % (ref 36.0–46.0)
Hemoglobin: 12.8 g/dL (ref 12.0–15.0)
Immature Granulocytes: 0 %
Lymphocytes Relative: 37 %
Lymphs Abs: 4.5 10*3/uL — ABNORMAL HIGH (ref 0.7–4.0)
MCH: 30.3 pg (ref 26.0–34.0)
MCHC: 32.7 g/dL (ref 30.0–36.0)
MCV: 92.4 fL (ref 80.0–100.0)
Monocytes Absolute: 0.8 10*3/uL (ref 0.1–1.0)
Monocytes Relative: 6 %
Neutro Abs: 6.3 10*3/uL (ref 1.7–7.7)
Neutrophils Relative %: 53 %
Platelets: 304 10*3/uL (ref 150–400)
RBC: 4.23 MIL/uL (ref 3.87–5.11)
RDW: 13.8 % (ref 11.5–15.5)
WBC: 12.1 10*3/uL — ABNORMAL HIGH (ref 4.0–10.5)
nRBC: 0 % (ref 0.0–0.2)

## 2023-10-23 LAB — COMPREHENSIVE METABOLIC PANEL WITH GFR
ALT: 23 U/L (ref 0–44)
AST: 24 U/L (ref 15–41)
Albumin: 3.5 g/dL (ref 3.5–5.0)
Alkaline Phosphatase: 40 U/L (ref 38–126)
Anion gap: 8 (ref 5–15)
BUN: 11 mg/dL (ref 6–20)
CO2: 23 mmol/L (ref 22–32)
Calcium: 8.6 mg/dL — ABNORMAL LOW (ref 8.9–10.3)
Chloride: 107 mmol/L (ref 98–111)
Creatinine, Ser: 1.18 mg/dL — ABNORMAL HIGH (ref 0.44–1.00)
GFR, Estimated: >60 mL/min (ref >60)
Glucose, Bld: 92 mg/dL (ref 70–99)
Potassium: 3.6 mmol/L (ref 3.5–5.1)
Sodium: 138 mmol/L (ref 135–145)
Total Bilirubin: 0.4 mg/dL (ref 0.0–1.2)
Total Protein: 6.3 g/dL — ABNORMAL LOW (ref 6.5–8.1)

## 2023-10-23 LAB — URINALYSIS, ROUTINE W REFLEX MICROSCOPIC
Bilirubin Urine: NEGATIVE
Glucose, UA: NEGATIVE mg/dL
Hgb urine dipstick: NEGATIVE
Ketones, ur: NEGATIVE mg/dL
Nitrite: POSITIVE — AB
Protein, ur: NEGATIVE mg/dL
Specific Gravity, Urine: 1.014 (ref 1.005–1.030)
WBC, UA: >50 WBC/hpf (ref 0–5)
pH: 6 (ref 5.0–8.0)

## 2023-10-23 LAB — HCG, SERUM, QUALITATIVE: Preg, Serum: NEGATIVE

## 2023-10-23 LAB — LIPASE, BLOOD: Lipase: 45 U/L (ref 11–51)

## 2023-10-23 SURGERY — LAPAROSCOPIC CHOLECYSTECTOMY WITH INTRAOPERATIVE CHOLANGIOGRAM
Anesthesia: General

## 2023-10-23 MED ORDER — FENTANYL CITRATE (PF) 250 MCG/5ML IJ SOLN
INTRAMUSCULAR | Status: AC
  Filled 2023-10-23: qty 5

## 2023-10-23 MED ORDER — HYDROMORPHONE HCL 1 MG/ML IJ SOLN
0.5000 mg | Freq: Once | INTRAMUSCULAR | Status: AC
  Administered 2023-10-23: 0.5 mg via INTRAVENOUS
  Filled 2023-10-23: qty 1

## 2023-10-23 MED ORDER — SUGAMMADEX SODIUM 200 MG/2ML IV SOLN
INTRAVENOUS | Status: DC | PRN
  Administered 2023-10-23: 200 mg via INTRAVENOUS

## 2023-10-23 MED ORDER — OXYCODONE HCL 5 MG/5ML PO SOLN: 5.0000 mg | Freq: Once | ORAL | Status: AC | PRN

## 2023-10-23 MED ORDER — ACETAMINOPHEN 500 MG PO TABS
1000.0000 mg | ORAL_TABLET | Freq: Once | ORAL | Status: AC
  Administered 2023-10-23: 1000 mg via ORAL
  Filled 2023-10-23: qty 2

## 2023-10-23 MED ORDER — FENTANYL CITRATE (PF) 100 MCG/2ML IJ SOLN
25.0000 ug | INTRAMUSCULAR | Status: DC | PRN
  Administered 2023-10-23: 50 ug via INTRAVENOUS

## 2023-10-23 MED ORDER — ONDANSETRON HCL 4 MG/2ML IJ SOLN
INTRAMUSCULAR | Status: DC | PRN
  Administered 2023-10-23: 4 mg via INTRAVENOUS

## 2023-10-23 MED ORDER — PHENYLEPHRINE HCL (PRESSORS) 10 MG/ML IV SOLN: INTRAVENOUS | Status: DC | PRN

## 2023-10-23 MED ORDER — ROCURONIUM BROMIDE 10 MG/ML (PF) SYRINGE
PREFILLED_SYRINGE | INTRAVENOUS | Status: DC | PRN
  Administered 2023-10-23: 20 mg via INTRAVENOUS
  Administered 2023-10-23: 70 mg via INTRAVENOUS

## 2023-10-23 MED ORDER — ROCURONIUM BROMIDE 10 MG/ML (PF) SYRINGE
PREFILLED_SYRINGE | INTRAVENOUS | Status: AC
  Filled 2023-10-23: qty 10

## 2023-10-23 MED ORDER — LACTATED RINGERS IV SOLN: INTRAVENOUS | Status: DC

## 2023-10-23 MED ORDER — LIDOCAINE 2% (20 MG/ML) 5 ML SYRINGE
INTRAMUSCULAR | Status: AC
  Filled 2023-10-23: qty 5

## 2023-10-23 MED ORDER — ONDANSETRON HCL 4 MG/2ML IJ SOLN
4.0000 mg | Freq: Once | INTRAMUSCULAR | Status: AC
  Administered 2023-10-23: 4 mg via INTRAVENOUS
  Filled 2023-10-23: qty 2

## 2023-10-23 MED ORDER — BUPIVACAINE-EPINEPHRINE 0.25% -1:200000 IJ SOLN
INTRAMUSCULAR | Status: DC | PRN
  Administered 2023-10-23: 16 mL

## 2023-10-23 MED ORDER — MIDAZOLAM HCL 2 MG/2ML IJ SOLN
INTRAMUSCULAR | Status: DC | PRN
  Administered 2023-10-23: 2 mg via INTRAVENOUS

## 2023-10-23 MED ORDER — PHENYLEPHRINE 80 MCG/ML (10ML) SYRINGE FOR IV PUSH (FOR BLOOD PRESSURE SUPPORT)
PREFILLED_SYRINGE | INTRAVENOUS | Status: DC | PRN
Start: 2023-10-23 — End: 2023-10-23
  Administered 2023-10-23 (×2): 80 ug via INTRAVENOUS

## 2023-10-23 MED ORDER — CHLORHEXIDINE GLUCONATE 0.12 % MT SOLN
OROMUCOSAL | Status: AC
  Administered 2023-10-23: 15 mL via OROMUCOSAL
  Filled 2023-10-23: qty 15

## 2023-10-23 MED ORDER — OXYCODONE HCL 5 MG PO TABS
5.0000 mg | ORAL_TABLET | Freq: Four times a day (QID) | ORAL | 0 refills | Status: AC | PRN
  Filled 2023-10-23: qty 20, 5d supply, fill #0

## 2023-10-23 MED ORDER — ORAL CARE MOUTH RINSE
15.0000 mL | Freq: Once | OROMUCOSAL | Status: AC
Start: 2023-10-23 — End: 2023-10-23

## 2023-10-23 MED ORDER — OXYCODONE HCL 5 MG PO TABS
5.0000 mg | ORAL_TABLET | Freq: Once | ORAL | Status: AC | PRN
  Administered 2023-10-23: 5 mg via ORAL

## 2023-10-23 MED ORDER — EPHEDRINE 5 MG/ML INJ
INTRAVENOUS | Status: AC
  Filled 2023-10-23: qty 5

## 2023-10-23 MED ORDER — ACETAMINOPHEN 10 MG/ML IV SOLN: 1000.0000 mg | Freq: Once | INTRAVENOUS | Status: DC | PRN

## 2023-10-23 MED ORDER — PROPOFOL 10 MG/ML IV BOLUS
INTRAVENOUS | Status: AC
  Filled 2023-10-23: qty 20

## 2023-10-23 MED ORDER — PHENYLEPHRINE 80 MCG/ML (10ML) SYRINGE FOR IV PUSH (FOR BLOOD PRESSURE SUPPORT)
PREFILLED_SYRINGE | INTRAVENOUS | Status: AC
  Filled 2023-10-23: qty 10

## 2023-10-23 MED ORDER — DROPERIDOL 2.5 MG/ML IJ SOLN: 0.6250 mg | Freq: Once | INTRAMUSCULAR | Status: DC | PRN

## 2023-10-23 MED ORDER — FENTANYL CITRATE (PF) 100 MCG/2ML IJ SOLN
INTRAMUSCULAR | Status: AC
  Filled 2023-10-23: qty 2

## 2023-10-23 MED ORDER — MORPHINE SULFATE (PF) 4 MG/ML IV SOLN
4.0000 mg | Freq: Once | INTRAVENOUS | Status: AC
  Administered 2023-10-23: 4 mg via INTRAVENOUS
  Filled 2023-10-23: qty 1

## 2023-10-23 MED ORDER — FENTANYL CITRATE (PF) 250 MCG/5ML IJ SOLN
INTRAMUSCULAR | Status: DC | PRN
  Administered 2023-10-23: 50 ug via INTRAVENOUS
  Administered 2023-10-23: 100 ug via INTRAVENOUS

## 2023-10-23 MED ORDER — PROPOFOL 10 MG/ML IV BOLUS
INTRAVENOUS | Status: DC | PRN
  Administered 2023-10-23: 200 mg via INTRAVENOUS

## 2023-10-23 MED ORDER — SODIUM CHLORIDE 0.9 % IV SOLN
1.0000 g | Freq: Once | INTRAVENOUS | Status: AC
  Administered 2023-10-23: 1 g via INTRAVENOUS
  Filled 2023-10-23: qty 10

## 2023-10-23 MED ORDER — SODIUM CHLORIDE 0.9 % IV SOLN
INTRAVENOUS | Status: DC | PRN
  Administered 2023-10-23: 100 mL

## 2023-10-23 MED ORDER — SODIUM CHLORIDE 0.9 % IV BOLUS
1000.0000 mL | Freq: Once | INTRAVENOUS | Status: AC
  Administered 2023-10-23: 1000 mL via INTRAVENOUS

## 2023-10-23 MED ORDER — ONDANSETRON HCL 4 MG/2ML IJ SOLN
INTRAMUSCULAR | Status: AC
  Filled 2023-10-23: qty 2

## 2023-10-23 MED ORDER — CHLORHEXIDINE GLUCONATE 0.12 % MT SOLN: 15.0000 mL | Freq: Once | OROMUCOSAL | Status: AC

## 2023-10-23 MED ORDER — OXYCODONE HCL 5 MG PO TABS
ORAL_TABLET | ORAL | Status: DC
Start: 2023-10-23 — End: 2023-10-23
  Filled 2023-10-23: qty 1

## 2023-10-23 MED ORDER — LIDOCAINE 2% (20 MG/ML) 5 ML SYRINGE
INTRAMUSCULAR | Status: DC | PRN
Start: 2023-10-23 — End: 2023-10-23
  Administered 2023-10-23: 100 mg via INTRAVENOUS

## 2023-10-23 MED ORDER — DEXAMETHASONE SODIUM PHOSPHATE 10 MG/ML IJ SOLN
INTRAMUSCULAR | Status: DC | PRN
  Administered 2023-10-23: 10 mg via INTRAVENOUS

## 2023-10-23 MED ORDER — MIDAZOLAM HCL 2 MG/2ML IJ SOLN
INTRAMUSCULAR | Status: AC
  Filled 2023-10-23: qty 2

## 2023-10-23 MED ORDER — BUPIVACAINE-EPINEPHRINE (PF) 0.25% -1:200000 IJ SOLN
INTRAMUSCULAR | Status: AC
  Filled 2023-10-23: qty 30

## 2023-10-23 MED ORDER — KETOROLAC TROMETHAMINE 30 MG/ML IJ SOLN
INTRAMUSCULAR | Status: AC
  Filled 2023-10-23: qty 1

## 2023-10-23 MED ORDER — KETOROLAC TROMETHAMINE 30 MG/ML IJ SOLN
INTRAMUSCULAR | Status: DC | PRN
  Administered 2023-10-23: 15 mg via INTRAVENOUS

## 2023-10-23 MED ORDER — DEXAMETHASONE SODIUM PHOSPHATE 10 MG/ML IJ SOLN
INTRAMUSCULAR | Status: AC
  Filled 2023-10-23: qty 1

## 2023-10-23 SURGICAL SUPPLY — 39 items
BAG COUNTER SPONGE SURGICOUNT (BAG) ×2 IMPLANT
BENZOIN TINCTURE PRP APPL 2/3 (GAUZE/BANDAGES/DRESSINGS) ×2 IMPLANT
BLADE CLIPPER SURG (BLADE) IMPLANT
CANISTER SUCT 3000ML PPV (MISCELLANEOUS) ×2 IMPLANT
CHLORAPREP W/TINT 26 (MISCELLANEOUS) ×2 IMPLANT
CLIP APPLIE ROT 10 11.4 M/L (STAPLE) ×2 IMPLANT
COVER MAYO STAND STRL (DRAPES) ×2 IMPLANT
COVER SURGICAL LIGHT HANDLE (MISCELLANEOUS) ×2 IMPLANT
DRAPE C-ARM 42X120 X-RAY (DRAPES) ×2 IMPLANT
DRSG TEGADERM 2-3/8X2-3/4 SM (GAUZE/BANDAGES/DRESSINGS) ×6 IMPLANT
DRSG TEGADERM 4X4.75 (GAUZE/BANDAGES/DRESSINGS) ×2 IMPLANT
ELECTRODE REM PT RTRN 9FT ADLT (ELECTROSURGICAL) ×2 IMPLANT
GAUZE SPONGE 2X2 8PLY STRL LF (GAUZE/BANDAGES/DRESSINGS) ×2 IMPLANT
GLOVE BIO SURGEON STRL SZ7 (GLOVE) ×2 IMPLANT
GLOVE BIOGEL PI IND STRL 7.5 (GLOVE) ×2 IMPLANT
GOWN STRL REUS W/ TWL LRG LVL3 (GOWN DISPOSABLE) ×6 IMPLANT
IRRIGATION SUCT STRKRFLW 2 WTP (MISCELLANEOUS) ×2 IMPLANT
KIT BASIN OR (CUSTOM PROCEDURE TRAY) ×2 IMPLANT
KIT IMAGING PINPOINTPAQ (MISCELLANEOUS) IMPLANT
KIT TURNOVER KIT B (KITS) ×2 IMPLANT
NS IRRIG 1000ML POUR BTL (IV SOLUTION) ×2 IMPLANT
PAD ARMBOARD POSITIONER FOAM (MISCELLANEOUS) ×2 IMPLANT
POUCH RETRIEVAL ECOSAC 10 (ENDOMECHANICALS) IMPLANT
SCISSORS LAP 5X35 DISP (ENDOMECHANICALS) ×2 IMPLANT
SET CHOLANGIOGRAPH 5 50 .035 (SET/KITS/TRAYS/PACK) ×2 IMPLANT
SET TUBE SMOKE EVAC HIGH FLOW (TUBING) ×2 IMPLANT
SLEEVE Z-THREAD 5X100MM (TROCAR) ×2 IMPLANT
SPECIMEN JAR SMALL (MISCELLANEOUS) ×2 IMPLANT
STRIP CLOSURE SKIN 1/2X4 (GAUZE/BANDAGES/DRESSINGS) ×2 IMPLANT
SUT MNCRL AB 4-0 PS2 18 (SUTURE) ×2 IMPLANT
SYSTEM BAG RETRIEVAL 10MM (BASKET) IMPLANT
TOWEL GREEN STERILE (TOWEL DISPOSABLE) ×2 IMPLANT
TOWEL GREEN STERILE FF (TOWEL DISPOSABLE) ×2 IMPLANT
TRAY LAPAROSCOPIC MC (CUSTOM PROCEDURE TRAY) ×2 IMPLANT
TROCAR 11X100 Z THREAD (TROCAR) ×2 IMPLANT
TROCAR BALLN 12MMX100 BLUNT (TROCAR) ×2 IMPLANT
TROCAR Z-THREAD OPTICAL 5X100M (TROCAR) ×2 IMPLANT
WARMER LAPAROSCOPE (MISCELLANEOUS) ×2 IMPLANT
WATER STERILE IRR 1000ML POUR (IV SOLUTION) ×2 IMPLANT

## 2023-10-23 NOTE — ED Provider Notes (Signed)
 Signout from [employer], PA-C at shift change. Briefly, patient presents for with right upper quadrant abdominal pain started last night.  Associate with nausea and vomiting without diarrhea.  No prior abdominal surgeries.  Ultrasound pending at this time.     6:33 AM Reassessment performed. Patient appears comfortable.  She does endorse some improvement of pain.  Continues to endorse right upper quadrant tenderness.  States she has been having the symptoms off and on for the past month with associated vomiting and diarrhea.  Denies any urinary symptoms.  Does report history of UTIs.  UA with evidence of UTI today.  Negative CVAT.  With bump of creatinine will give fluids and Rocephin  for UTI.  She does have a leukocytosis but is afebrile with out any evidence of cholecystitis on ultrasound.  Labs and imaging personally reviewed and interpreted including: hcg negative, lipase WNL, creatinine with bump to 1.18, CBC with leukocytosis of 12.1, UA notable for positive nitrites, large amount of leukocytes, many bacteria    Most current vital signs reviewed and are as follows: BP 123/80 (BP Location: Left Wrist)   Pulse 76   Temp 98.4 F (36.9 C) (Oral)   Resp 17   Ht 5\' 7"  (1.702 m)   Wt 112.9 kg   LMP 08/04/2023   SpO2 99% Comment: Simultaneous filing. User may not have seen previous data.  BMI 39.00 kg/m   Discussed patient with Maryalice Smaller from general surgery.  Plan for cholecystectomy today.       Felicie Horning, PA-C 10/23/23 1302    Eldon Greenland, MD 10/23/23 2113

## 2023-10-23 NOTE — Anesthesia Preprocedure Evaluation (Signed)
 Anesthesia Evaluation  Patient identified by MRN, date of birth, ID band Patient awake    Reviewed: Allergy & Precautions, H&P , NPO status , Patient's Chart, lab work & pertinent test results  Airway Mallampati: II  TM Distance: >3 FB Neck ROM: Full    Dental no notable dental hx.    Pulmonary Current Smoker   Pulmonary exam normal breath sounds clear to auscultation       Cardiovascular hypertension, Normal cardiovascular exam Rhythm:Regular Rate:Normal     Neuro/Psych  PSYCHIATRIC DISORDERS  Depression    negative neurological ROS     GI/Hepatic Neg liver ROS,,,cholecystitis   Endo/Other  diabetes, Type 2    Renal/GU negative Renal ROS  negative genitourinary   Musculoskeletal negative musculoskeletal ROS (+)    Abdominal   Peds negative pediatric ROS (+)  Hematology negative hematology ROS (+)   Anesthesia Other Findings   Reproductive/Obstetrics negative OB ROS                              Anesthesia Physical Anesthesia Plan  ASA: 2  Anesthesia Plan: General   Post-op Pain Management: Tylenol  PO (pre-op)*   Induction: Intravenous  PONV Risk Score and Plan: 2 and Ondansetron , Dexamethasone , Midazolam  and Treatment may vary due to age or medical condition  Airway Management Planned: Oral ETT  Additional Equipment:   Intra-op Plan:   Post-operative Plan: Extubation in OR  Informed Consent: I have reviewed the patients History and Physical, chart, labs and discussed the procedure including the risks, benefits and alternatives for the proposed anesthesia with the patient or authorized representative who has indicated his/her understanding and acceptance.     Dental advisory given  Plan Discussed with: CRNA  Anesthesia Plan Comments:          Anesthesia Quick Evaluation

## 2023-10-23 NOTE — ED Provider Notes (Signed)
 [facility]ERGENCY DEPT [facility] Emergency Department Provider Note MRN:  [id number]  Arrival date & time: 10/23/23     Chief Complaint   Abdominal Pain   History of Present Illness   Lorraine Rogers is a 33 y.o. year-old female presents to the ED with chief complaint of right upper quadrant abdominal pain that awaken her from sleep tonight.  She reports associated nausea and vomiting.  She denies any diarrhea.  She denies fevers or chills.  She states that she has had the symptoms in the past.  She does have some association with the pain worsening after eating.  She denies having taken anything for symptoms.  She states that the pain caused her to have a panic attack.  She denies any prior abdominal surgeries.  History provided by patient.   Review of Systems  Pertinent positive and negative review of systems noted in HPI.    Physical Exam   Vitals:   10/23/23 1515 10/23/23 1530  BP: 120/79 128/74  Pulse: 60 (!) 53  Resp: 17 13  Temp:  97.6 F (36.4 C)  SpO2: 100% 100%    CONSTITUTIONAL:  non toxic-appearing, NAD NEURO:  Alert and oriented x 3, CN 3-12 grossly intact EYES:  eyes equal and reactive ENT/NECK:  Supple, no stridor  CARDIO:  normal rate, regular rhythm, appears well-perfused  PULM:  No respiratory distress, CTAB GI/GU:  non-distended, RUQ TTP MSK/SPINE:  No gross deformities, no edema, moves all extremities  SKIN:  no rash, atraumatic   *Additional and/or pertinent findings included in MDM below  Diagnostic and Interventional Summary    EKG Interpretation Date/Time:  Tuesday October 23 2023 03:29:00 EDT Ventricular Rate:  66 PR Interval:  201 QRS Duration:  101 QT Interval:  381 QTC Calculation: 400 R Axis:   60  Text Interpretation: Sinus rhythm Probable left atrial enlargement Confirmed by Eldon Greenland ([id number]) on 10/23/2023 3:36:20 AM       Labs Reviewed  COMPREHENSIVE METABOLIC PANEL WITH GFR - Abnormal; Notable for the following  components:      Result Value   Creatinine, Ser 1.18 (*)    Calcium 8.6 (*)    Total Protein 6.3 (*)    All other components within normal limits  CBC WITH DIFFERENTIAL/PLATELET - Abnormal; Notable for the following components:   WBC 12.1 (*)    Lymphs Abs 4.5 (*)    All other components within normal limits  URINALYSIS, ROUTINE W REFLEX MICROSCOPIC - Abnormal; Notable for the following components:   APPearance HAZY (*)    Nitrite POSITIVE (*)    Leukocytes,Ua LARGE (*)    Bacteria, UA MANY (*)    All other components within normal limits  LIPASE, BLOOD  HCG, SERUM, QUALITATIVE  SURGICAL PATHOLOGY    DG Cholangiogram Operative  Final Result    US  Abdomen Limited RUQ (LIVER/GB)  Final Result      Medications  HYDROmorphone  (DILAUDID ) injection 0.5 mg (0.5 mg Intravenous Given 10/23/23 0347)  ondansetron  (ZOFRAN ) injection 4 mg (4 mg Intravenous Given 10/23/23 0346)  morphine  (PF) 4 MG/ML injection 4 mg (4 mg Intravenous Given 10/23/23 0720)  sodium chloride  0.9 % bolus 1,000 mL (1,000 mLs Intravenous New Bag/Given 10/23/23 0858)  cefTRIAXone  (ROCEPHIN ) 1 g in sodium chloride  0.9 % 100 mL IVPB (0 g Intravenous Stopped 10/23/23 0935)  chlorhexidine  (PERIDEX ) 0.12 % solution 15 mL (15 mLs Mouth/Throat Given 10/23/23 1244)    Or  Oral care mouth rinse ( Mouth Rinse See Alternative 10/23/23  1244)  oxyCODONE  (Oxy IR/ROXICODONE ) immediate release tablet 5 mg (5 mg Oral Given 10/23/23 1544)    Or  oxyCODONE  (ROXICODONE ) 5 MG/5ML solution 5 mg ( Oral See Alternative 10/23/23 1544)  acetaminophen  (TYLENOL ) tablet 1,000 mg (1,000 mg Oral Given 10/23/23 1244)     Procedures  /  Critical Care Procedures  ED Course and Medical Decision Making  I have reviewed the triage vital signs, the nursing notes, and pertinent available records from the EMR.  Social Determinants Affecting Complexity of Care: Patient has no clinically significant social determinants affecting this chief  complaint..   ED Course:    Medical Decision Making Patient here with moderate right upper quadrant pain.  Has had associated nausea and vomiting.  She is tender on exam.  I do have some concern for gallbladder etiology.  Will check labs and right upper quadrant ultrasound.  Right upper quadrant ultrasound pending.  Patient signed out to oncoming team.  Amount and/or Complexity of Data Reviewed Labs: ordered. Radiology: ordered.  Risk Prescription drug management. Decision regarding hospitalization.         Consultants: Patient signed out oncoming team at shift change.  See consult notes.   Treatment and Plan: Patient's exam and diagnostic results are concerning for symptomatic cholelithiasis.  Feel that patient will need admission to the hospital for further treatment and evaluation.    Final Clinical Impressions(s) / ED Diagnoses     ICD-10-CM   1. Calculus of gallbladder without cholecystitis without obstruction  K80.20       ED Discharge Orders          Ordered    Diet general        10/23/23 1445    Increase activity slowly        10/23/23 1445    May shower / Bathe        10/23/23 1445    Driving Restrictions       Comments: Do not drive while taking pain medications   10/23/23 1445    Call MD for:  temperature >100.4        10/23/23 1445    Call MD for:  persistant nausea and vomiting        10/23/23 1445    Call MD for:  severe uncontrolled pain        10/23/23 1445    Call MD for:  redness, tenderness, or signs of infection (pain, swelling, redness, odor or green/yellow discharge around incision site)        10/23/23 1445    oxyCODONE  (OXY IR/ROXICODONE ) 5 MG immediate release tablet  Every 6 hours PRN        10/23/23 1445              Discharge Instructions Discussed with and Provided to Patient:     Discharge Instructions      [facility] LAPAROSCOPIC SURGERY: POST OP INSTRUCTIONS Always review  your discharge instruction sheet given to you by the facility where your surgery was performed. IF YOU HAVE DISABILITY OR FAMILY LEAVE FORMS, YOU MUST BRING THEM TO THE OFFICE FOR PROCESSING.   DO NOT GIVE THEM TO YOUR DOCTOR.  A prescription for pain medication may be given to you upon discharge.  Take your pain medication as prescribed, if needed.  If narcotic pain medicine is not needed, then you may take acetaminophen  (Tylenol ) or ibuprofen  (Advil ) as needed. Take your usually prescribed medications unless otherwise directed. If you need a refill  on your pain medication, please contact your pharmacy.  They will contact our office to request authorization. Prescriptions will not be filled after 5pm or on week-ends. You should follow a light diet the first few days after arrival home, such as soup and crackers, etc.  Be sure to include lots of fluids daily. Most patients will experience some swelling and bruising in the area of the incisions.  Ice packs will help.  Swelling and bruising can take several days to resolve.  It is common to experience some constipation if taking pain medication after surgery.  Increasing fluid intake and taking a stool softener (such as Colace) will usually help or prevent this problem from occurring.  A mild laxative (Milk of Magnesia or Miralax) should be taken according to package instructions if there are no bowel movements after 48 hours. Unless discharge instructions indicate otherwise, you may remove your bandages 24-48 hours after surgery, and you may shower at that time.  You may have steri-strips (small skin tapes) in place directly over the incision.  These strips should be left on the skin for 7-10 days.  If your surgeon used skin glue on the incision, you may shower in 24 hours.  The glue will flake off over the next 2-3 weeks.  Any sutures or staples will be removed at the office during your follow-up visit. ACTIVITIES:  You may resume regular (light) daily  activities beginning the next day--such as daily self-care, walking, climbing stairs--gradually increasing activities as tolerated.  You may have sexual intercourse when it is comfortable.  Refrain from any heavy lifting or straining until approved by your doctor. You may drive when you are no longer taking prescription pain medication, you can comfortably wear a seatbelt, and you can safely maneuver your car and apply brakes. RETURN TO WORK:  __________________________________________________________ Elene Griffes should see your doctor in the office for a follow-up appointment approximately 2-3 weeks after your surgery.  Make sure that you call for this appointment within a day or two after you arrive home to insure a convenient appointment time. OTHER INSTRUCTIONS: __________________________________________________________________________________________________________________________ __________________________________________________________________________________________________________________________ WHEN TO CALL YOUR DOCTOR: Fever over 101.0 Inability to urinate Continued bleeding from incision. Increased pain, redness, or drainage from the incision. Increasing abdominal pain  The clinic staff is available to answer your questions during regular business hours.  Please don't hesitate to call and ask to speak to one of the nurses for clinical concerns.  If you have a medical emergency, go to the nearest emergency room or call 911.  A surgeon from [facility] is always on call at the hospital. [address], [place], [place]  [address] ? [address] [address], [place], [place]   [address] [phone] ? [phone] ? FAX [phone] Web site: [email or web address]        Sherel Dikes, PA-C 10/23/23 2203    Eldon Greenland, MD 10/25/23 [phone]

## 2023-10-23 NOTE — Anesthesia Procedure Notes (Signed)
 Procedure Name: Intubation Date/Time: 10/23/2023 1:36 PM  Performed by: Bennett Brass, CRNAPre-anesthesia Checklist: Patient identified, Emergency Drugs available, Suction available and Patient being monitored Patient Re-evaluated:Patient Re-evaluated prior to induction Oxygen Delivery Method: Circle system utilized Preoxygenation: Pre-oxygenation with 100% oxygen Induction Type: IV induction Ventilation: Mask ventilation without difficulty Laryngoscope Size: Miller and 2 Grade View: Grade I Tube type: Oral Tube size: 7.0 mm Number of attempts: 1 Airway Equipment and Method: Stylet and Oral airway Placement Confirmation: ETT inserted through vocal cords under direct vision, positive ETCO2 and breath sounds checked- equal and bilateral Secured at: 22 cm Tube secured with: Tape Dental Injury: Teeth and Oropharynx as per pre-operative assessment
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
## 2023-10-23 NOTE — Op Note (Signed)
 Laparoscopic Cholecystectomy with IOC Procedure Note  Indications: 33 year old female presents with acute RUQ abdominal pain with nausea and vomiting. She has had some similar symptoms over the last few weeks, associated with eating fried foods. The pain radiates around to her back. No fever.   Work-up in the ED revealed gallstones with no sign of acute cholecystitis.  However, her symptoms persisted, so we were asked to see the patient for treatment.  We recommended laparoscopic cholecystectomy.    Pre-operative Diagnosis: Calculus of gallbladder with acute cholecystitis, without mention of obstruction  Post-operative Diagnosis: Calculus of gallbladder with other cholecystitis, without mention of obstruction  Surgeon: Rella Cardinal   Assistants: none  Anesthesia: General endotracheal anesthesia  ASA Class: 2  Procedure Details  The patient was seen again in the Holding Room. The risks, benefits, complications, treatment options, and expected outcomes were discussed with the patient. The possibilities of reaction to medication, pulmonary aspiration, perforation of viscus, bleeding, recurrent infection, finding a normal gallbladder, the need for additional procedures, failure to diagnose a condition, the possible need to convert to an open procedure, and creating a complication requiring transfusion or operation were discussed with the patient. The likelihood of improving the patient's symptoms with return to their baseline status is good.  The patient and/or family concurred with the proposed plan, giving informed consent. The site of surgery properly noted. The patient was taken to Operating Room, identified as Lorraine Rogers and the procedure verified as Laparoscopic Cholecystectomy with Intraoperative Cholangiogram. A Time Out was held and the above information confirmed.  Prior to the induction of general anesthesia, antibiotic prophylaxis was administered. General endotracheal anesthesia was  then administered and tolerated well. After the induction, the abdomen was prepped with Chloraprep and draped in the sterile fashion. The patient was positioned in the supine position.  Local anesthetic agent was injected into the skin below the umbilicus and an incision made. We dissected down to the abdominal fascia with blunt dissection.  The fascia was incised vertically and we entered the peritoneal cavity bluntly.  She has a small umbilical hernia defect that was included in the opening.  A pursestring suture of 0-Vicryl was placed around the fascial opening.  The Hasson cannula was inserted and secured with the stay suture.  Pneumoperitoneum was then created with CO2 and tolerated well without any adverse changes in the patient's vital signs. An 11-mm port was placed in the subxiphoid position.  Two 5-mm ports were placed in the right upper quadrant. All skin incisions were infiltrated with a local anesthetic agent before making the incision and placing the trocars.   We positioned the patient in reverse Trendelenburg, tilted slightly to the patient's left.  The gallbladder was identified, the fundus grasped and retracted cephalad. The gallbladder shows no sign of acute cholecystitis, but there are signs of mild chronic cholecystitis with some flimsy adhesions.  Adhesions were lysed bluntly and with the electrocautery where indicated, taking care not to injure any adjacent organs or viscus. The infundibulum was grasped and retracted laterally, exposing the peritoneum overlying the triangle of Calot. This was then divided and exposed in a blunt fashion. A critical view of the cystic duct and cystic artery was obtained.  The cystic duct was clearly identified and bluntly dissected circumferentially. The cystic duct was ligated with a clip distally.   An incision was made in the cystic duct and the [facility] cholangiogram catheter introduced. The catheter was secured using a clip. A cholangiogram was then  obtained which showed good visualization of the distal and proximal biliary tree with no sign of filling defects or obstruction.  Contrast flowed easily into the duodenum. The catheter was then removed.   The cystic duct was then ligated with clips and divided. The cystic artery was identified, dissected free, ligated with clips and divided as well.   The gallbladder was dissected from the liver bed in retrograde fashion with the electrocautery. The gallbladder was removed and placed in an Eco sac. The liver bed was irrigated and inspected. Hemostasis was achieved with the electrocautery. Copious irrigation was utilized and was repeatedly aspirated until clear.  The gallbladder and Eco sac were then removed through the umbilical port site.  The pursestring suture was used to close the umbilical fascia.    We again inspected the right upper quadrant for hemostasis.  Pneumoperitoneum was released as we removed the trocars.  4-0 Monocryl was used to close the skin.   Benzoin, steri-strips, and clean dressings were applied. The patient was then extubated and brought to the recovery room in stable condition. Instrument, sponge, and needle counts were correct at closure and at the conclusion of the case.   Findings: Cholecystitis with Cholelithiasis  Estimated Blood Loss: Minimal         Drains: none         Specimens: Gallbladder           Complications: None; patient tolerated the procedure well.         Disposition: PACU - hemodynamically stable.         Condition: stable  Lorraine Rogers. Lorraine Grizzle, MD, [facility]  General Surgery   10/23/2023 2:44 PM

## 2023-10-23 NOTE — ED Notes (Signed)
Pt given a ginger ale for PO challenge  

## 2023-10-23 NOTE — Anesthesia Postprocedure Evaluation (Signed)
 Anesthesia Post Note  Patient: Shyah Cadmus  Procedure(s) Performed: LAPAROSCOPIC CHOLECYSTECTOMY WITH INTRAOPERATIVE CHOLANGIOGRAM     Patient location during evaluation: PACU Anesthesia Type: General Level of consciousness: awake and alert Pain management: pain level controlled Vital Signs Assessment: post-procedure vital signs reviewed and stable Respiratory status: spontaneous breathing, nonlabored ventilation, respiratory function stable and patient connected to nasal cannula oxygen Cardiovascular status: blood pressure returned to baseline and stable Postop Assessment: no apparent nausea or vomiting Anesthetic complications: no   No notable events documented.  Last Vitals:  Vitals:   10/23/23 1515 10/23/23 1530  BP: 120/79 128/74  Pulse: 60 (!) 53  Resp: 17 13  Temp:  36.4 C  SpO2: 100% 100%    Last Pain:  Vitals:   10/23/23 1530  TempSrc:   PainSc: Asleep                 Lethaniel Rave

## 2023-10-23 NOTE — Transfer of Care (Signed)
 Immediate Anesthesia Transfer of Care Note  Patient: Lorraine Rogers  Procedure(s) Performed: LAPAROSCOPIC CHOLECYSTECTOMY WITH INTRAOPERATIVE CHOLANGIOGRAM  Patient Location: PACU  Anesthesia Type:General  Level of Consciousness: awake, alert , and oriented  Airway & Oxygen Therapy: Patient Spontanous Breathing  Post-op Assessment: Report given to RN and Post -op Vital signs reviewed and stable  Post vital signs: Reviewed and stable  Last Vitals:  Vitals Value Taken Time  BP 128/71 10/23/23 1451  Temp    Pulse 70 10/23/23 1454  Resp 13 10/23/23 1454  SpO2 100 % 10/23/23 1454  Vitals shown include unfiled device data.  Last Pain:  Vitals:   10/23/23 1242  TempSrc:   PainSc: 7          Complications: No notable events documented.

## 2023-10-23 NOTE — ED Triage Notes (Addendum)
 Pt BIB [facility] d/t RUQ ABD pain with N/V

## 2023-10-23 NOTE — Discharge Instructions (Addendum)
 [facility] ______[facility] LAPAROSCOPIC SURGERY: POST OP INSTRUCTIONS Always review your discharge instruction sheet given to you by the facility where your surgery was performed. IF YOU HAVE DISABILITY OR FAMILY LEAVE FORMS, YOU MUST BRING THEM TO THE OFFICE FOR PROCESSING.   DO NOT GIVE THEM TO YOUR DOCTOR.  A prescription for pain medication may be given to you upon discharge.  Take your pain medication as prescribed, if needed.  If narcotic pain medicine is not needed, then you may take acetaminophen (Tylenol) or ibuprofen (Advil) as needed. Take your usually prescribed medications unless otherwise directed. If you need a refill on your pain medication, please contact your pharmacy.  They will contact our office to request authorization. Prescriptions will not be filled after 5pm or on week-ends. You should follow a light diet the first few days after arrival home, such as soup and crackers, etc.  Be sure to include lots of fluids daily. Most patients will experience some swelling and bruising in the area of the incisions.  Ice packs will help.  Swelling and bruising can take several days to resolve.  It is common to experience some constipation if taking pain medication after surgery.  Increasing fluid intake and taking a stool softener (such as Colace) will usually help or prevent this problem from occurring.  A mild laxative (Milk of Magnesia or Miralax) should be taken according to package instructions if there are no bowel movements after 48 hours. Unless discharge instructions indicate otherwise, you may remove your bandages 24-48 hours after surgery, and you may shower at that time.  You may have steri-strips (small skin tapes) in place directly over the incision.  These strips should be left on the skin for 7-10 days.  If your surgeon used skin glue on the incision, you may shower in 24 hours.  The glue will flake off over the next 2-3 weeks.  Any sutures or staples will be  removed at the office during your follow-up visit. ACTIVITIES:  You may resume regular (light) daily activities beginning the next day--such as daily self-care, walking, climbing stairs--gradually increasing activities as tolerated.  You may have sexual intercourse when it is comfortable.  Refrain from any heavy lifting or straining until approved by your doctor. You may drive when you are no longer taking prescription pain medication, you can comfortably wear a seatbelt, and you can safely maneuver your car and apply brakes. RETURN TO WORK:  __________________________________________________________ Bonita Quin should see your doctor in the office for a follow-up appointment approximately 2-3 weeks after your surgery.  Make sure that you call for this appointment within a day or two after you arrive home to insure a convenient appointment time. OTHER INSTRUCTIONS: __________________________________________________________________________________________________________________________ __________________________________________________________________________________________________________________________ WHEN TO CALL YOUR DOCTOR: Fever over 101.0 Inability to urinate Continued bleeding from incision. Increased pain, redness, or drainage from the incision. Increasing abdominal pain  The clinic staff is available to answer your questions during regular business hours.  Please don't hesitate to call and ask to speak to one of the nurses for clinical concerns.  If you have a medical emergency, go to the nearest emergency room or call 911.  A surgeon from [facility] is always on call at the hospital. [address], [address], [place], [place]  [address] ? [address] [address], [place], [place]   [address] [phone] ? [phone] ? FAX [phone] Web site: [email or web address]

## 2023-10-24 ENCOUNTER — Encounter (HOSPITAL_COMMUNITY): Payer: Self-pay | Admitting: Surgery

## 2023-10-24 LAB — SURGICAL PATHOLOGY

## 2023-10-30 ENCOUNTER — Ambulatory Visit: Payer: MEDICAID | Admitting: Clinical

## 2024-01-18 ENCOUNTER — Encounter: Payer: Self-pay | Admitting: Advanced Practice Midwife

## 2024-03-27 ENCOUNTER — Encounter (HOSPITAL_COMMUNITY): Payer: Self-pay | Admitting: Emergency Medicine

## 2024-03-27 ENCOUNTER — Ambulatory Visit (INDEPENDENT_AMBULATORY_CARE_PROVIDER_SITE_OTHER): Payer: MEDICAID

## 2024-03-27 ENCOUNTER — Other Ambulatory Visit: Payer: Self-pay

## 2024-03-27 ENCOUNTER — Ambulatory Visit (HOSPITAL_COMMUNITY)
Admission: EM | Admit: 2024-03-27 | Discharge: 2024-03-27 | Disposition: A | Discharge status: Home / Self Care | Payer: MEDICAID | Attending: Family Medicine | Admitting: Family Medicine

## 2024-03-27 DIAGNOSIS — M25572 Pain in left ankle and joints of left foot: Secondary | ICD-10-CM

## 2024-03-27 DIAGNOSIS — M76822 Posterior tibial tendinitis, left leg: Secondary | ICD-10-CM | POA: Diagnosis not present

## 2024-03-27 MED ORDER — DEXAMETHASONE SODIUM PHOSPHATE 10 MG/ML IJ SOLN
INTRAMUSCULAR | Status: AC
  Filled 2024-03-27: qty 1

## 2024-03-27 MED ORDER — DEXAMETHASONE SODIUM PHOSPHATE 10 MG/ML IJ SOLN
10.0000 mg | Freq: Once | INTRAMUSCULAR | Status: AC
  Administered 2024-03-27: 10 mg via INTRAMUSCULAR
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
## 2024-03-27 NOTE — ED Triage Notes (Signed)
 Pt c/o left ankle pain since yesterday getting worse today. Pt denies any fall or injury. No taking any pain medication at this time.

## 2024-03-27 NOTE — Discharge Instructions (Signed)
 Meds ordered this encounter  Medications   dexamethasone (DECADRON) injection 10 mg

## 2024-08-07 ENCOUNTER — Other Ambulatory Visit: Payer: Self-pay

## 2024-08-07 ENCOUNTER — Encounter (HOSPITAL_COMMUNITY): Payer: Self-pay | Admitting: Emergency Medicine

## 2024-08-07 ENCOUNTER — Emergency Department (HOSPITAL_COMMUNITY)
Admission: EM | Admit: 2024-08-07 | Discharge: 2024-08-07 | Discharge status: Against Medical Advice | Payer: MEDICAID | Attending: Emergency Medicine | Admitting: Emergency Medicine

## 2024-08-07 DIAGNOSIS — R55 Syncope and collapse: Secondary | ICD-10-CM | POA: Insufficient documentation

## 2024-08-07 DIAGNOSIS — Z5321 Procedure and treatment not carried out due to patient leaving prior to being seen by health care provider: Secondary | ICD-10-CM | POA: Insufficient documentation

## 2024-08-07 DIAGNOSIS — H538 Other visual disturbances: Secondary | ICD-10-CM | POA: Insufficient documentation

## 2024-08-07 NOTE — ED Provider Triage Note (Cosign Needed)
 Emergency Medicine Provider Triage Evaluation Note  Chaniyah Jahr , a 34 y.o. female  was evaluated in triage.  Pt complains of syncope. Donated plasma and then went to [employer]. Was standing when left eye went black and right eye was blurry. Walked outside and then syncopsized. Currently asymptomatic. Denies chest pain, SOB, headache, current vision changes.   Review of Systems  Positive:  Negative:   Physical Exam  BP 123/79 (BP Location: Left Arm)   Pulse 72   Temp 98.1 F (36.7 C) (Oral)   Resp 16   SpO2 100%  Gen:   Awake, no distress  Resp:  Normal effort  MSK:   Moves extremities without difficulty  Other:    Medical Decision Making  Medically screening exam initiated at 2:43 PM.  Appropriate orders placed.  Shann Lewellyn was informed that the remainder of the evaluation will be completed by another provider, this initial triage assessment does not replace that evaluation, and the importance of remaining in the ED until their evaluation is complete.     Hoy Nidia FALCON, [place] 08/07/24 1444

## 2024-08-07 NOTE — ED Triage Notes (Signed)
 Patient presents post plasma donation due to dizziness and blurry vision. While outside of a store, she sat down to keep from falling. She also complains of a dry cough for 2 weeks.  YK:[id number]  EMS vitals 156 CBG 100% SPO2 on room air 86 HR 100/60-> 117/66 BP
